# Patient Record
Sex: Female | Born: 1935 | Race: White | Hispanic: No | Smoking: Never smoker
Health system: Southern US, Community
[De-identification: ages and names within clinical notes are randomized; demographics above are authoritative.]

## PROBLEM LIST (undated history)

## (undated) DIAGNOSIS — C801 Malignant (primary) neoplasm, unspecified: Secondary | ICD-10-CM

## (undated) DIAGNOSIS — R0602 Shortness of breath: Secondary | ICD-10-CM

## (undated) DIAGNOSIS — G20A1 Parkinson's disease without dyskinesia, without mention of fluctuations: Secondary | ICD-10-CM

## (undated) DIAGNOSIS — Z9049 Acquired absence of other specified parts of digestive tract: Secondary | ICD-10-CM

## (undated) DIAGNOSIS — I2510 Atherosclerotic heart disease of native coronary artery without angina pectoris: Secondary | ICD-10-CM

## (undated) DIAGNOSIS — H3530 Unspecified macular degeneration: Secondary | ICD-10-CM

## (undated) DIAGNOSIS — K219 Gastro-esophageal reflux disease without esophagitis: Secondary | ICD-10-CM

## (undated) DIAGNOSIS — Z955 Presence of coronary angioplasty implant and graft: Secondary | ICD-10-CM

## (undated) DIAGNOSIS — F419 Anxiety disorder, unspecified: Secondary | ICD-10-CM

## (undated) DIAGNOSIS — Z9289 Personal history of other medical treatment: Secondary | ICD-10-CM

## (undated) DIAGNOSIS — K5792 Diverticulitis of intestine, part unspecified, without perforation or abscess without bleeding: Secondary | ICD-10-CM

## (undated) DIAGNOSIS — K224 Dyskinesia of esophagus: Secondary | ICD-10-CM

## (undated) DIAGNOSIS — I10 Essential (primary) hypertension: Secondary | ICD-10-CM

## (undated) DIAGNOSIS — R21 Rash and other nonspecific skin eruption: Secondary | ICD-10-CM

## (undated) DIAGNOSIS — H9190 Unspecified hearing loss, unspecified ear: Secondary | ICD-10-CM

## (undated) DIAGNOSIS — J189 Pneumonia, unspecified organism: Secondary | ICD-10-CM

## (undated) DIAGNOSIS — IMO0002 Reserved for concepts with insufficient information to code with codable children: Secondary | ICD-10-CM

## (undated) DIAGNOSIS — E039 Hypothyroidism, unspecified: Secondary | ICD-10-CM

## (undated) DIAGNOSIS — K229 Disease of esophagus, unspecified: Secondary | ICD-10-CM

## (undated) DIAGNOSIS — Z8719 Personal history of other diseases of the digestive system: Secondary | ICD-10-CM

## (undated) DIAGNOSIS — G20 Parkinson's disease: Secondary | ICD-10-CM

## (undated) DIAGNOSIS — K469 Unspecified abdominal hernia without obstruction or gangrene: Secondary | ICD-10-CM

## (undated) DIAGNOSIS — Z87440 Personal history of urinary (tract) infections: Secondary | ICD-10-CM

## (undated) DIAGNOSIS — M1990 Unspecified osteoarthritis, unspecified site: Secondary | ICD-10-CM

## (undated) HISTORY — PX: ABDOMINAL HYSTERECTOMY: SHX81

## (undated) HISTORY — PX: OTHER SURGICAL HISTORY: SHX169

## (undated) HISTORY — DX: Dyskinesia of esophagus: K22.4

## (undated) HISTORY — DX: Essential (primary) hypertension: I10

## (undated) HISTORY — PX: LUMBAR LAMINECTOMY: SHX95

## (undated) HISTORY — PX: BLADDER REPAIR: SHX76

## (undated) HISTORY — PX: HEMORRHOID SURGERY: SHX153

---
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
## 1976-03-31 HISTORY — PX: HERNIA REPAIR: SHX51

## 1990-03-31 HISTORY — PX: KNEE ARTHROSCOPY: SHX127

## 1990-03-31 HISTORY — PX: KNEE SURGERY: SHX244

## 1999-04-01 HISTORY — PX: CORONARY ANGIOPLASTY WITH STENT PLACEMENT: SHX49

## 2004-03-31 HISTORY — PX: OTHER SURGICAL HISTORY: SHX169

## 2004-03-31 HISTORY — PX: TOTAL ABDOMINAL HYSTERECTOMY W/ BILATERAL SALPINGOOPHORECTOMY: SHX83

## 2007-04-01 HISTORY — PX: LAMINECTOMY: SHX219

## 2009-03-31 HISTORY — PX: EYE SURGERY: SHX253

## 2009-08-26 ENCOUNTER — Emergency Department (HOSPITAL_BASED_OUTPATIENT_CLINIC_OR_DEPARTMENT_OTHER): Admission: EM | Admit: 2009-08-26 | Discharge: 2009-08-27 | Payer: Self-pay | Admitting: Emergency Medicine

## 2010-03-31 ENCOUNTER — Emergency Department (HOSPITAL_BASED_OUTPATIENT_CLINIC_OR_DEPARTMENT_OTHER)
Admission: EM | Admit: 2010-03-31 | Discharge: 2010-03-31 | Disposition: A | Discharge status: Other Acute Inpatient Hospital | Payer: Self-pay | Source: Home / Self Care | Admitting: Emergency Medicine

## 2010-03-31 ENCOUNTER — Inpatient Hospital Stay (HOSPITAL_COMMUNITY)
Admission: AD | Admit: 2010-03-31 | Discharge: 2010-04-10 | Payer: Self-pay | Source: Home / Self Care | Attending: Internal Medicine | Admitting: Internal Medicine

## 2010-04-03 LAB — DIFFERENTIAL
Basophils Absolute: 0.1 10*3/uL (ref 0.0–0.1)
Basophils Relative: 1 % (ref 0–1)
Eosinophils Absolute: 0.5 10*3/uL (ref 0.0–0.7)
Eosinophils Relative: 8 % — ABNORMAL HIGH (ref 0–5)
Lymphocytes Relative: 18 % (ref 12–46)
Lymphs Abs: 1.1 10*3/uL (ref 0.7–4.0)
Monocytes Absolute: 0.7 10*3/uL (ref 0.1–1.0)
Monocytes Relative: 11 % (ref 3–12)
Neutro Abs: 3.9 10*3/uL (ref 1.7–7.7)
Neutrophils Relative %: 62 % (ref 43–77)

## 2010-04-03 LAB — CBC
HCT: 36 % (ref 36.0–46.0)
Hemoglobin: 11.5 g/dL — ABNORMAL LOW (ref 12.0–15.0)
MCH: 30.3 pg (ref 26.0–34.0)
MCHC: 31.9 g/dL (ref 30.0–36.0)
MCV: 94.7 fL (ref 78.0–100.0)
Platelets: 279 10*3/uL (ref 150–400)
RBC: 3.8 MIL/uL — ABNORMAL LOW (ref 3.87–5.11)
RDW: 14.3 % (ref 11.5–15.5)
WBC: 6.3 10*3/uL (ref 4.0–10.5)

## 2010-04-03 LAB — BASIC METABOLIC PANEL
BUN: 5 mg/dL — ABNORMAL LOW (ref 6–23)
CO2: 34 mEq/L — ABNORMAL HIGH (ref 19–32)
Calcium: 9.2 mg/dL (ref 8.4–10.5)
Chloride: 91 mEq/L — ABNORMAL LOW (ref 96–112)
Creatinine, Ser: 0.71 mg/dL (ref 0.4–1.2)
GFR calc Af Amer: >60 mL/min (ref >60)
GFR calc non Af Amer: >60 mL/min (ref >60)
Glucose, Bld: 95 mg/dL (ref 70–99)
Potassium: 3.4 mEq/L — ABNORMAL LOW (ref 3.5–5.1)
Sodium: 134 mEq/L — ABNORMAL LOW (ref 135–145)

## 2010-04-05 LAB — CBC
HCT: 35.2 % — ABNORMAL LOW (ref 36.0–46.0)
Hemoglobin: 11.4 g/dL — ABNORMAL LOW (ref 12.0–15.0)
MCH: 30.8 pg (ref 26.0–34.0)
MCHC: 32.4 g/dL (ref 30.0–36.0)
MCV: 95.1 fL (ref 78.0–100.0)
Platelets: 296 10*3/uL (ref 150–400)
RBC: 3.7 MIL/uL — ABNORMAL LOW (ref 3.87–5.11)
RDW: 14 % (ref 11.5–15.5)
WBC: 6.5 10*3/uL (ref 4.0–10.5)

## 2010-04-05 LAB — BASIC METABOLIC PANEL
BUN: 9 mg/dL (ref 6–23)
CO2: 33 mEq/L — ABNORMAL HIGH (ref 19–32)
Calcium: 9.3 mg/dL (ref 8.4–10.5)
Chloride: 92 mEq/L — ABNORMAL LOW (ref 96–112)
Creatinine, Ser: 0.76 mg/dL (ref 0.4–1.2)
GFR calc Af Amer: >60 mL/min (ref >60)
GFR calc non Af Amer: >60 mL/min (ref >60)
Glucose, Bld: 84 mg/dL (ref 70–99)
Potassium: 3.4 mEq/L — ABNORMAL LOW (ref 3.5–5.1)
Sodium: 135 mEq/L (ref 135–145)

## 2010-04-05 LAB — APTT: aPTT: 44 seconds — ABNORMAL HIGH (ref 24–37)

## 2010-04-05 LAB — PROTIME-INR
INR: 1.05 (ref 0.00–1.49)
Prothrombin Time: 13.9 seconds (ref 11.6–15.2)

## 2010-04-15 LAB — BASIC METABOLIC PANEL
BUN: 10 mg/dL (ref 6–23)
BUN: 10 mg/dL (ref 6–23)
CO2: 31 mEq/L (ref 19–32)
CO2: 22 mEq/L (ref 19–32)
Calcium: 9.1 mg/dL (ref 8.4–10.5)
Calcium: 9 mg/dL (ref 8.4–10.5)
Chloride: 98 mEq/L (ref 96–112)
Chloride: 104 mEq/L (ref 96–112)
Creatinine, Ser: 0.78 mg/dL (ref 0.4–1.2)
Creatinine, Ser: 0.83 mg/dL (ref 0.4–1.2)
GFR calc Af Amer: >60 mL/min (ref >60)
GFR calc Af Amer: >60 mL/min (ref >60)
GFR calc non Af Amer: >60 mL/min (ref >60)
GFR calc non Af Amer: >60 mL/min (ref >60)
Glucose, Bld: 102 mg/dL — ABNORMAL HIGH (ref 70–99)
Glucose, Bld: 57 mg/dL — ABNORMAL LOW (ref 70–99)
Potassium: 3.7 mEq/L (ref 3.5–5.1)
Potassium: 3.7 mEq/L (ref 3.5–5.1)
Sodium: 138 mEq/L (ref 135–145)
Sodium: 139 mEq/L (ref 135–145)

## 2010-04-15 LAB — CBC
HCT: 36.3 % (ref 36.0–46.0)
HCT: 38.2 % (ref 36.0–46.0)
Hemoglobin: 11.7 g/dL — ABNORMAL LOW (ref 12.0–15.0)
Hemoglobin: 12.2 g/dL (ref 12.0–15.0)
MCH: 30.5 pg (ref 26.0–34.0)
MCH: 30.3 pg (ref 26.0–34.0)
MCHC: 32.2 g/dL (ref 30.0–36.0)
MCHC: 31.9 g/dL (ref 30.0–36.0)
MCV: 94.8 fL (ref 78.0–100.0)
MCV: 95 fL (ref 78.0–100.0)
Platelets: 292 10*3/uL (ref 150–400)
Platelets: 298 10*3/uL (ref 150–400)
RBC: 3.83 MIL/uL — ABNORMAL LOW (ref 3.87–5.11)
RBC: 4.02 MIL/uL (ref 3.87–5.11)
RDW: 14 % (ref 11.5–15.5)
RDW: 14.1 % (ref 11.5–15.5)
WBC: 7.9 10*3/uL (ref 4.0–10.5)
WBC: 5.6 10*3/uL (ref 4.0–10.5)

## 2010-04-15 LAB — D-DIMER, QUANTITATIVE: D-Dimer, Quant: 1.16 ug/mL-FEU — ABNORMAL HIGH (ref 0.00–0.48)

## 2010-04-15 LAB — BRAIN NATRIURETIC PEPTIDE: Pro B Natriuretic peptide (BNP): 76.4 pg/mL (ref 0.0–100.0)

## 2010-04-19 ENCOUNTER — Ambulatory Visit
Admission: RE | Admit: 2010-04-19 | Discharge: 2010-04-19 | Payer: Self-pay | Source: Home / Self Care | Attending: Internal Medicine | Admitting: Internal Medicine

## 2010-04-19 DIAGNOSIS — I509 Heart failure, unspecified: Secondary | ICD-10-CM | POA: Insufficient documentation

## 2010-04-19 DIAGNOSIS — J309 Allergic rhinitis, unspecified: Secondary | ICD-10-CM | POA: Insufficient documentation

## 2010-04-19 DIAGNOSIS — R32 Unspecified urinary incontinence: Secondary | ICD-10-CM | POA: Insufficient documentation

## 2010-04-19 DIAGNOSIS — G252 Other specified forms of tremor: Secondary | ICD-10-CM

## 2010-04-19 DIAGNOSIS — M4800 Spinal stenosis, site unspecified: Secondary | ICD-10-CM | POA: Insufficient documentation

## 2010-04-19 DIAGNOSIS — R05 Cough: Secondary | ICD-10-CM | POA: Insufficient documentation

## 2010-04-19 DIAGNOSIS — K219 Gastro-esophageal reflux disease without esophagitis: Secondary | ICD-10-CM | POA: Insufficient documentation

## 2010-04-19 DIAGNOSIS — I10 Essential (primary) hypertension: Secondary | ICD-10-CM | POA: Insufficient documentation

## 2010-04-19 DIAGNOSIS — G250 Essential tremor: Secondary | ICD-10-CM | POA: Insufficient documentation

## 2010-04-25 NOTE — Discharge Summary (Addendum)
Caitlin Richards, Caitlin Richards               ACCOUNT NO.:  [id number]  MEDICAL RECORD NO.:  [id number]          PATIENT TYPE:  INP  LOCATION:  1331                         FACILITY:  [facility]  PHYSICIAN:  Hind I Elsaid, MD      DATE OF BIRTH:  July 19, 1916  DATE OF ADMISSION:  03/31/2010 DATE OF DISCHARGE:  04/09/2010                              DISCHARGE SUMMARY   PRIMARY CARE PHYSICIAN:  Joycelyn Rua, MD  DISCHARGE DIAGNOSES: 1. Community-acquired pneumonia. 2. Acute bronchitis, most probably bacterial bronchitis. 3. Systemic inflammatory response, resolved. 4. Dysphagia, felt to be secondary to achalasia status post     esophagogastroduodenoscopy and Botox injection. 5. History of Parkinsonism. 6. Constipation. 7. Questionable depression. 8. Hypothyroidism.  CONSULTATION:  Gastroenterology consulted, done by Jordan Hawks. Elnoria Howard, MD.  PROCEDURE: 1. Chest x-ray, lower lobe prominent interstitial  thickening, favored     to represent chronic bronchitis/smoking. 2. Chest x-ray, increased bilateral lower lobe atelectasis suspicious     for worsening pneumonia  and aspiration. 3. Barium swallow.  Severe stricture at the gastroesophageal junction,     differential includes benign/malignant stricture, favor benign     stricture, large hiatal hernia. 4. Chest x-ray, slight improvement in aeration in the lung bases.  HISTORY OF PRESENT ILLNESS:  This is a 77 year old female who was evaluated  at [facility]  for cough and shortness of breath.  The patient admitted to have 2 weeks history of severe cough with wheezing and shortness of breath.  She has been coughing yellowish- colored mucus.  The patient tried outpatient medical management with nebulizer and oral antibiotic without significant improvement. Accordingly, the patient presented to the hospital with a fever of 100.4.  In the emergency room, the patient had a temperature of 98.3 and T-max of 100.4, pulse rate 81,  respiratory rate 17, and blood pressure 121/61, saturating 96% on 2 L.  The patient also found to have white blood cells of 13.9.  The patient admitted to the hospital,  treated as a case of acute bronchitis/community acquired pneumonia with Zithromax and Rocephin.  The patient had significant coughing with mucus, yellowish phlegm.  This was treated supportively.  The patient's cough completely resolved.  Currently, the patient was able to complete full sentence without coughing, but also the patient was noticed to have dysphagia.  For that esophagram was done,  which did show severe stricture.  Gastroenterology consulted, done by Dr. Elnoria Howard, where he recommended manometry.  The patient kept over the weekend for evaluation of manometry, but unfortunately not been  done as the patient did not tolerate it.  The patient had another endoscopy today, which did show achalasia and 10 cm  hiatal hernia, status post 25 units of Botox injection.  There was no evidence of a stricture or secondary achalasia. The patient tolerated the procedure well, but the patient is still today feeling weak with some shortness of breath.  I will repeat chest x-ray two-view AP and lateral  and D-dimer.  If all of this is negative, the patient could be discharged tomorrow with Avelox 400 mg p.o. daily to complete a total  of 14 days.  The patient also needs to continue with her nebulizer treatment.  Currently, we feel the patient is stable for discharge in the morning.     Hind Bosie Helper, MD     HIE/MEDQ  D:  04/09/2010  T:  04/09/2010  Job:  [id number]  Electronically Signed by Ebony Cargo MD on 04/25/2010 12:44:51 PM

## 2010-05-02 NOTE — Assessment & Plan Note (Signed)
Summary: Pulmonary/ new pt eval ? uacs/ pseudo wheeze   Visit Type:  Initial Consult Copy to:  Dr. Joycelyn Rua Primary Provider/Referring Provider:  Dr. Joycelyn Rua  CC:  Followup PNA.  History of Present Illness: 83 yowf never regular smoker bothered by "allergies and asthma all her life" under care of an allergist last well  2009 where only only needed an occasional allegra while living in [place].  April 19, 2010  1st pulmonary office eval ov new onset difficulty breathing and sense of nose and chest congestion indolent/ progressive but especially bad since October 2011. no better with allegra or spiriva then ended up in hospital with dx of pna and very large HH dx'd on ct with severe stricture on barium swallow   rx as cap, botox rx for ? achalasia and swallowing better, still cough mostly dry assoc with nasal congestion but no purulent nasal secretions.   Pt denies presently  any significant sore throat, dysphagia, itching, sneezing,  fever, chills, sweats, unintended wt loss, pleuritic or exertional cp, hempoptysis, change in activity tolerance  orthopnea pnd or leg swelling. Pt also denies any obvious fluctuation in symptoms with weather or environmental change or other alleviating or aggravating factors.       Current Medications (verified): 1)  Levothyroxine Sodium 25 Mcg Tabs (Levothyroxine Sodium) .Marland Kitchen.. 1 Once Daily 2)  Hydrochlorothiazide 12.5 Mg Caps (Hydrochlorothiazide) .Marland Kitchen.. 1 Once Daily 3)  Metoprolol Tartrate 50 Mg Tabs (Metoprolol Tartrate) .Marland Kitchen.. 1 Once Daily 4)  Carbidopa-Levodopa 10-100 Mg Tabs (Carbidopa-Levodopa) .Marland Kitchen.. 1 Three Times A Day 5)  Detrol La 4 Mg Xr24h-Cap (Tolterodine Tartrate) .Marland Kitchen.. 1 At Bedtime 6)  Aspirin 81 Mg Tbec (Aspirin) .Marland Kitchen.. 1 Once Daily 7)  Centrum Silver  Tabs (Multiple Vitamins-Minerals) .Marland Kitchen.. 1 Once Daily 8)  Calcium Plus Vitamin D 600-100 Mg-Unit Caps (Calcium Carbonate-Vitamin D) .Marland Kitchen.. 1  Two Times A Day 9)  Biotin 5000 5 Mg Caps (Biotin)  .Marland Kitchen.. 1 Once Daily 10)  Fish Oil 1200 Mg Caps (Omega-3 Fatty Acids) .Marland Kitchen.. 1 At Bedtime 11)  Optive 0.5-0.9 % Soln (Carboxymethylcellul-Glycerin) .Marland Kitchen.. 1 Drop Each Eye Two Times A Day 12)  Prilosec 20 Mg Cpdr (Omeprazole) .Marland Kitchen.. 1 Every Am 13)  Docusate Sodium 100 Mg Caps (Docusate Sodium) .Marland Kitchen.. 1 Two Times A Day As Needed 14)  Dulcolax 5 Mg Tbec (Bisacodyl) .... 2 At Bedtime As Needed 15)  Nasonex 50 Mcg/act Susp (Mometasone Furoate) .... 2 Sprays Each Nostril Once Daily As Needed 16)  Allegra Allergy 60 Mg Tabs (Fexofenadine Hcl) .Marland Kitchen.. 1 Once Daily As Needed 17)  Patanol 0.1 % Soln (Olopatadine Hcl) .Marland Kitchen.. 1 Drop Each Eye Two Times A Day As Needed  Allergies (verified): 1)  ! Sulfa 2)  ! * Hydralazine 3)  ! * Plavix 4)  ! Percocet 5)  ! * Lisinopril  Past History:  Past Medical History: Esophageal dysfunction ? achalasia......................Marland KitchenHung      - EGD with botox 04/09/2010 Allergies/ asthma....................................................Marland KitchenWert  Past Surgical History: Appendectomy 1934 T & A 1925 Hysterectomy 1959 Hernia repair 1978 Rt knee surgery 1992 Cardiac stent 2001 Oopherectomy 2006 Bilateral cataract surgery 2006 L4, L5 Laminectomy 2009  Family History: Asthma- Father and PGM  Social History: Widowed with 3 children Never smoker Lives with daughter RetiredFirefighter of medical record dept  ETOH rare  Review of Systems       The patient complains of nasal congestion/difficulty breathing through nose.  The patient denies shortness of breath with activity, shortness of breath at rest,  productive cough, non-productive cough, coughing up blood, chest pain, irregular heartbeats, acid heartburn, indigestion, loss of appetite, weight change, abdominal pain, difficulty swallowing, sore throat, tooth/dental problems, headaches, sneezing, itching, ear ache, anxiety, depression, hand/feet swelling, joint stiffness or pain, rash, change in color of mucus, and fever.     Vital Signs:  Patient profile:   77 year old female Height:      62 inches Weight:      135 pounds BMI:     24.78 O2 Sat:      94 % on Room air Temp:     98.0 degrees F oral Pulse rate:   83 / minute BP sitting:   130 / 74  (left arm)  Vitals Entered By: Vernie Murders (April 19, 2010 9:46 AM)  O2 Flow:  Room air  Physical Exam  Additional Exam:  pleasant amb wf < stated age quite hoarse with classic pseudowheeze resolves with purse lip maneuver  wt 135 April 20, 2010  HEENT mild turbinate edema.  Oropharynx no thrush or excess pnd or cobblestoning.  No JVD or cervical adenopathy. Mild accessory muscle hypertrophy. Trachea midline, nl thryroid. Chest was hyperinflated by percussion with diminished breath sounds and moderate increased exp time without wheeze. Hoover sign positive at mid inspiration. Regular rate and rhythm without murmur gallop or rub or increase P2 or edema.  Abd: no hsm, nl excursion. Ext warm without cyanosis or clubbing.     CT of Chest  Procedure date:  04/10/2010  Findings:      Multiple bilateral small pathchy infiltrates assoc with hube HH and thickening of the esphageal mucosa diffusely  Impression & Recommendations:  Problem # 1:  COUGH (ICD-786.2)  The most common causes of chronic cough in immunocompetent adults include: upper airway cough syndrome (UACS), previously referred to as postnasal drip syndrome,  caused by variety of rhinosinus conditions; (2) asthma; (3) GERD; (4) chronic bronchitis from cigarette smoking or other inhaled environmental irritants; (5) nonasthmatic eosinophilic bronchitis; and (6) bronchiectasis. These conditions, singly or in combination, have accounted for up to 94% of the causes of chronic cough in prospective studies.   DDX of  difficult airways managment all start with A and  include Adherence, Ace Inhibitors, Acid Reflux, Active Sinus Disease, Alpha 1 Antitripsin deficiency, Anxiety masquerading as Airways dz,   ABPA,  allergy(esp in young), Aspiration (esp in elderly), Adverse effects of DPI,  Active smokers, plus two Bs  = Beta blocker use/ bronchiectasis.  and one C = CHF  Cross refrencing this ddx yields the most likely dx here:  Acid reflux:  max rx reviewed  Beta blockers: prefer here Bystolic, the most beta -1  selective Beta blocker available in sample form, with bisoprolol the most selective generic choice  on the market.   Aspiration, still a concern given age and underlying achalasia reported  Active sinus dz > needs sinus ct if not improving  Bronchiectasis excluded by ct   Each maintenance medication was reviewed in detail including most importantly the difference between maintenance and as needed and under what circumstances the prns are to be used. See instructions for specific recommendations   Orders: New Patient Level V (24401)  Medications Added to Medication List This Visit: 1)  Levothyroxine Sodium 25 Mcg Tabs (Levothyroxine sodium) .Marland Kitchen.. 1 once daily 2)  Hydrochlorothiazide 12.5 Mg Caps (Hydrochlorothiazide) .Marland Kitchen.. 1 once daily 3)  Metoprolol Tartrate 50 Mg Tabs (Metoprolol tartrate) .Marland Kitchen.. 1 once daily 4)  Carbidopa-levodopa 10-100 Mg Tabs (Carbidopa-levodopa) .Marland KitchenMarland KitchenMarland Kitchen  1 three times a day 5)  Detrol La 4 Mg Xr24h-cap (Tolterodine tartrate) .Marland Kitchen.. 1 at bedtime 6)  Aspirin 81 Mg Tbec (Aspirin) .Marland Kitchen.. 1 once daily 7)  Centrum Silver Tabs (Multiple vitamins-minerals) .Marland Kitchen.. 1 once daily 8)  Calcium Plus Vitamin D 600-100 Mg-unit Caps (Calcium carbonate-vitamin d) .Marland Kitchen.. 1  two times a day 9)  Biotin 5000 5 Mg Caps (Biotin) .Marland Kitchen.. 1 once daily 10)  Fish Oil 1200 Mg Caps (Omega-3 fatty acids) .Marland Kitchen.. 1 at bedtime 11)  Optive 0.5-0.9 % Soln (Carboxymethylcellul-glycerin) .Marland Kitchen.. 1 drop each eye two times a day 12)  Prilosec 20 Mg Cpdr (Omeprazole) .Marland Kitchen.. 1 every am 13)  Prilosec 20 Mg Cpdr (Omeprazole) .... Take one 30-60 min before first and last meals of the day 14)  Docusate Sodium 100 Mg Caps  (Docusate sodium) .Marland Kitchen.. 1 two times a day as needed 15)  Dulcolax 5 Mg Tbec (Bisacodyl) .... 2 at bedtime as needed 16)  Nasonex 50 Mcg/act Susp (Mometasone furoate) .... 2 sprays each nostril once daily as needed 17)  Allegra Allergy 60 Mg Tabs (Fexofenadine hcl) .Marland Kitchen.. 1 once daily as needed 18)  Patanol 0.1 % Soln (Olopatadine hcl) .Marland Kitchen.. 1 drop each eye two times a day as needed 19)  Pepcid 20 Mg Tabs (Famotidine) .... Take one by mouth at bedtime 20)  Bystolic 5 Mg Tabs (Nebivolol hcl) .... One tablet daily   Patient Instructions: 1)  stop metaprolol and fish oil 2)  Bystolic 5mg  one daily in place of metaprolol 3)  Prilosec 20mg   Take one 30-60 min before first and last meals of the day and Pepcid 20 mg one at bedtime 4)  GERD (REFLUX)  is a common cause of respiratory symptoms. It commonly presents without heartburn and can be treated with medication, but also with lifestyle changes including avoidance of late meals, excessive alcohol, smoking cessation, and avoid fatty foods, chocolate, peppermint, colas, red wine, and acidic juices such as orange juice. NO MINT OR MENTHOL PRODUCTS SO NO COUGH DROPS  5)  USE SUGARLESS CANDY INSTEAD (jolley ranchers)  6)  NO OIL BASED VITAMINS  7)  ok restart spiriva if you feel you're loosing ground 8)  Please schedule a follow-up appointment in 2 weeks, sooner if needed with cxr on return   Immunization History:  Influenza Immunization History:    Influenza:  historical (12/31/2009)  Pneumovax Immunization History:    Pneumovax:  historical (04/01/2010)

## 2010-05-03 ENCOUNTER — Other Ambulatory Visit: Payer: Self-pay | Admitting: Internal Medicine

## 2010-05-03 ENCOUNTER — Ambulatory Visit (INDEPENDENT_AMBULATORY_CARE_PROVIDER_SITE_OTHER)
Admission: RE | Admit: 2010-05-03 | Discharge: 2010-05-03 | Disposition: A | Discharge status: Home / Self Care | Payer: Medicare Other | Source: Ambulatory Visit | Attending: Internal Medicine | Admitting: Internal Medicine

## 2010-05-03 ENCOUNTER — Encounter: Payer: Self-pay | Admitting: Internal Medicine

## 2010-05-03 ENCOUNTER — Ambulatory Visit: Admit: 2010-05-03 | Payer: Self-pay | Admitting: Internal Medicine

## 2010-05-03 ENCOUNTER — Ambulatory Visit (INDEPENDENT_AMBULATORY_CARE_PROVIDER_SITE_OTHER): Payer: Medicare Other | Admitting: Internal Medicine

## 2010-05-03 DIAGNOSIS — J309 Allergic rhinitis, unspecified: Secondary | ICD-10-CM

## 2010-05-03 DIAGNOSIS — R05 Cough: Secondary | ICD-10-CM

## 2010-05-08 NOTE — Assessment & Plan Note (Signed)
Summary: Pulmonary/ ext summary ov for upper airway cough   Vital Signs:  Patient profile:   77 year old female Weight:      135 pounds O2 Sat:      95 % on Room air Temp:     98.2 degrees F oral Pulse rate:   66 / minute BP sitting:   114 / 62  (left arm)  Vitals Entered By: Vernie Murders (May 03, 2010 9:09 AM)  O2 Flow:  Room air  Copy to:  Dr. Joycelyn Rua Primary Provider/Referring Provider:  Dr. Joycelyn Rua  CC:  Dyspnea- improved.  History of Present Illness: 18 yowf never regular smoker bothered by "allergies and asthma all her life" under care of an allergist last well  2009 where only only needed an occasional allegra while living in [place].  April 19, 2010  1st pulmonary office eval ov new onset difficulty breathing and sense of nose and chest congestion indolent/ progressive but especially bad since October 2011. no better with allegra or spiriva then ended up in hospital with dx of pna and very large HH dx'd on ct with severe stricture on barium swallow   rx as cap, botox rx for ? achalasia and swallowing better, still cough mostly dry assoc with nasal congestion but no purulent nasal secretions.  imp was uacs vs asthma  rec stop metaprolol and fish oil Bystolic 5mg  one daily in place of metaprolol Prilosec 20mg   Take one 30-60 min before first and last meals of the day and Pepcid 20 mg one at bedtime GERD (REFLUX) diet   ok restart spiriva if you feel you're loosing ground > did not stop it ? why    May 03, 2010 ov cc cough assoc with throat and nasal congestion in am using nasonex as needed in ams only. no purulent secretions.  not limited by sob or having noct resp cos.  Pt denies any significant sore throat, dysphagia, itching, sneezing,   fever, chills, sweats, unintended wt loss, pleuritic or exertional cp, hempoptysis, change in activity tolerance  orthopnea pnd or leg swelling. Pt also denies any obvious fluctuation in symptoms with weather or  environmental change or other alleviating or aggravating factors.             Current Medications (verified): 1)  Levothyroxine Sodium 25 Mcg Tabs (Levothyroxine Sodium) .Marland Kitchen.. 1 Once Daily 2)  Hydrochlorothiazide 12.5 Mg Caps (Hydrochlorothiazide) .Marland Kitchen.. 1 Once Daily 3)  Carbidopa-Levodopa 10-100 Mg Tabs (Carbidopa-Levodopa) .Marland Kitchen.. 1 Every Am, 1 Every Afternoon and 2 Every Evening 4)  Detrol La 4 Mg Xr24h-Cap (Tolterodine Tartrate) .Marland Kitchen.. 1 At Bedtime 5)  Aspirin 81 Mg Tbec (Aspirin) .Marland Kitchen.. 1 Once Daily 6)  Centrum Silver  Tabs (Multiple Vitamins-Minerals) .Marland Kitchen.. 1 Once Daily 7)  Calcium Plus Vitamin D 600-100 Mg-Unit Caps (Calcium Carbonate-Vitamin D) .Marland Kitchen.. 1  Two Times A Day 8)  Optive 0.5-0.9 % Soln (Carboxymethylcellul-Glycerin) .Marland Kitchen.. 1 Drop Each Eye Two Times A Day 9)  Prilosec 20 Mg Cpdr (Omeprazole) .... Take One 30-60 Min Before First and Last Meals of The Day 10)  Docusate Sodium 100 Mg Caps (Docusate Sodium) .Marland Kitchen.. 1 Two Times A Day As Needed 11)  Dulcolax 5 Mg Tbec (Bisacodyl) .... 2 At Bedtime As Needed 12)  Nasonex 50 Mcg/act Susp (Mometasone Furoate) .... 2 Sprays Each Nostril Once Daily As Needed 13)  Allegra Allergy 60 Mg Tabs (Fexofenadine Hcl) .Marland Kitchen.. 1 Once Daily As Needed 14)  Patanol 0.1 % Soln (Olopatadine Hcl) .Marland KitchenMarland KitchenMarland Kitchen  1 Drop Each Eye Two Times A Day As Needed 15)  Pepcid 20 Mg Tabs (Famotidine) .... Take One By Mouth At Bedtime 16)  Bystolic 5 Mg  Tabs (Nebivolol Hcl) .... One Tablet Daily 17)  Spiriva Handihaler 18 Mcg Caps (Tiotropium Bromide Monohydrate) .... Inhale Contents of 1 Cap Daily  Allergies (verified): 1)  ! Sulfa 2)  ! * Hydralazine 3)  ! * Plavix 4)  ! Percocet 5)  ! * Lisinopril  Past History:  Past Medical History: Esophageal dysfunction ? achalasia......................Marland KitchenHung      - EGD with botox 04/09/2010 Allergies/ asthma.....................................................Marland KitchenWert HBP    - Changed metaprolol to bystolic Apr 19 2010 due to ? B2  effect  Physical Exam  Additional Exam:  pleasant amb wf < stated age quite hoarse with classic pseudowheeze resolves completely with purse lip maneuver  wt 135 April 20, 2010 > 135 May 03, 2010  HEENT mild turbinate edema.  Oropharynx no thrush or excess pnd or cobblestoning.  No JVD or cervical adenopathy. Mild accessory muscle hypertrophy. Trachea midline, nl thryroid. Chest was hyperinflated by percussion with diminished breath sounds and moderate increased exp time without wheeze. Hoover sign positive at mid inspiration. Regular rate and rhythm without murmur gallop or rub or increase P2 or edema.  Abd: no hsm, nl excursion. Ext warm without cyanosis or clubbing.     Impression & Recommendations:  Problem # 1:  COUGH (ICD-786.2)  The most common causes of chronic cough in immunocompetent adults include: upper airway cough syndrome (UACS), previously referred to as postnasal drip syndrome,  caused by variety of rhinosinus conditions; (2) asthma; (3) GERD; (4) chronic bronchitis from cigarette smoking or other inhaled environmental irritants; (5) nonasthmatic eosinophilic bronchitis; and (6) bronchiectasis. These conditions, singly or in combination, have accounted for up to 94% of the causes of chronic cough in prospective studies.   DDX of  difficult airways managment all start with A and  include Adherence, Ace Inhibitors, Acid Reflux, Active Sinus Disease, Alpha 1 Antitripsin deficiency, Anxiety masquerading as Airways dz,  ABPA,  allergy(esp in young), Aspiration (esp in elderly), Adverse effects of DPI,  Active smokers, plus two Bs  = Beta blocker use/ bronchiectasis.  and one C = CHF  Cross refrencing this ddx yields the most likely dx here:  Acid reflux:  max rx reviewed  Beta blockers: prefer here Bystolic, the most beta -1  selective Beta blocker available in sample form, with bisoprolol the most selective generic choice  on the market.   Aspiration, still a concern given  age and underlying achalasia reported  Active sinus dz > needs sinus ct if not improving  ? adverse effect of DPI  > try off spiriva as per previous recs.  If she has copd at all it would be most likely the nl result of aging and not accelerated airflow obstruction. as long as not limting doe, may ge  no need for spiriva even if she does prove to have it.  Bronchiectasis excluded by ct   Each maintenance medication was reviewed in detail including most importantly the difference between maintenance and as needed and under what circumstances the prns are to be used. See instructions for specific recommendation    Problem # 2:  ALLERGIC RHINITIS (ICD-477.9)  Her updated medication list for this problem includes:    Nasonex 50 Mcg/act Susp (Mometasone furoate) .Marland Kitchen... 2 sprays each nostril once daily as needed    Allegra Allergy 60 Mg Tabs (Fexofenadine hcl) .Marland KitchenMarland KitchenMarland KitchenMarland Kitchen  1 once daily as needed  Due to am symptoms needs to use nasonex at hs.   Each maintenance medication was reviewed in detail including most importantly the difference between maintenance and as needed and under what circumstances the prns are to be used. See instructions for specific recommendations      Medications Added to Medication List This Visit: 1)  Carbidopa-levodopa 10-100 Mg Tabs (Carbidopa-levodopa) .Marland Kitchen.. 1 every am, 1 every afternoon and 2 every evening 2)  Spiriva Handihaler 18 Mcg Caps (Tiotropium bromide monohydrate) .... Inhale contents of 1 cap daily  Other Orders: T-2 View CXR (71020TC) Est. Patient Level IV (16109)  Patient Instructions: 1)  Stop spiriva.  I think of spiriva in this setting like purchasing high octane fuel for an older car with lots of miles on it. It may help the perfomance enough to warrant the purchase, but it won't change the longevity of the car or make it any easier parking it. It should improve peak performance if the patient is patient and lets the medicine work the way it's intended  -  so when it's stopped it takes a while to wear off 2)  Nasonex should be used every night at bedtime 3)  Calcium supplements should be citrate, not carbonate 4)  Please schedule a follow-up appointment in 6 weeks, sooner if needed    Orders Added: 1)  T-2 View CXR [[id number]] 2)  Est. Patient Level IV [60454]     CXR  Procedure date:  05/03/2010  Findings:      1.  Since 04/10/2010, chest x-ray resolution previous patchy bilateral lower lobe airspace opacities (best visualized on CT angio chest).  Stable perihilar peribronchial cuffing of known asthma. 2.  Large hiatus hernia. 3.  No interval acute findings

## 2010-06-10 LAB — URINALYSIS, ROUTINE W REFLEX MICROSCOPIC
Glucose, UA: NEGATIVE mg/dL
Ketones, ur: 40 mg/dL — AB
Leukocytes, UA: NEGATIVE
Nitrite: NEGATIVE
Specific Gravity, Urine: 1.015 (ref 1.005–1.030)
pH: 6 (ref 5.0–8.0)

## 2010-06-10 LAB — BASIC METABOLIC PANEL
BUN: 11 mg/dL (ref 6–23)
BUN: 6 mg/dL (ref 6–23)
CO2: 26 mEq/L (ref 19–32)
CO2: 30 mEq/L (ref 19–32)
Calcium: 9.4 mg/dL (ref 8.4–10.5)
Chloride: 98 mEq/L (ref 96–112)
Chloride: 94 mEq/L — ABNORMAL LOW (ref 96–112)
Creatinine, Ser: 0.7 mg/dL (ref 0.4–1.2)
GFR calc Af Amer: >60 mL/min (ref >60)
GFR calc non Af Amer: >60 mL/min (ref >60)
Glucose, Bld: 125 mg/dL — ABNORMAL HIGH (ref 70–99)
Glucose, Bld: 102 mg/dL — ABNORMAL HIGH (ref 70–99)
Potassium: 2.8 mEq/L — ABNORMAL LOW (ref 3.5–5.1)
Potassium: 3.4 mEq/L — ABNORMAL LOW (ref 3.5–5.1)
Sodium: 139 mEq/L (ref 135–145)
Sodium: 139 mEq/L (ref 135–145)

## 2010-06-10 LAB — URINE MICROSCOPIC-ADD ON

## 2010-06-10 LAB — CBC
HCT: 39.1 % (ref 36.0–46.0)
HCT: 35.3 % — ABNORMAL LOW (ref 36.0–46.0)
HCT: 45 % (ref 36.0–46.0)
Hemoglobin: 13.4 g/dL (ref 12.0–15.0)
Hemoglobin: 14.7 g/dL (ref 12.0–15.0)
MCH: 30.3 pg (ref 26.0–34.0)
MCHC: 34.3 g/dL (ref 30.0–36.0)
MCV: 88.5 fL (ref 78.0–100.0)
MCV: 94.3 fL (ref 78.0–100.0)
MCV: 94.4 fL (ref 78.0–100.0)
Platelets: 248 10*3/uL (ref 150–400)
Platelets: 231 10*3/uL (ref 150–400)
RBC: 4.42 MIL/uL (ref 3.87–5.11)
RBC: 3.74 MIL/uL — ABNORMAL LOW (ref 3.87–5.11)
RDW: 14.2 % (ref 11.5–15.5)
RDW: 14.2 % (ref 11.5–15.5)
WBC: 13.9 10*3/uL — ABNORMAL HIGH (ref 4.0–10.5)
WBC: 5 10*3/uL (ref 4.0–10.5)
WBC: 7.5 10*3/uL (ref 4.0–10.5)

## 2010-06-10 LAB — CULTURE, RESPIRATORY W GRAM STAIN
Culture: NORMAL
Gram Stain: NONE SEEN

## 2010-06-10 LAB — DIFFERENTIAL
Basophils Absolute: 0 10*3/uL (ref 0.0–0.1)
Eosinophils Relative: 0 % (ref 0–5)
Lymphocytes Relative: 10 % — ABNORMAL LOW (ref 12–46)
Lymphs Abs: 1.4 10*3/uL (ref 0.7–4.0)
Monocytes Relative: 12 % (ref 3–12)

## 2010-06-14 ENCOUNTER — Ambulatory Visit: Payer: PRIVATE HEALTH INSURANCE | Admitting: Internal Medicine

## 2010-06-19 ENCOUNTER — Telehealth: Payer: Self-pay | Admitting: Internal Medicine

## 2010-06-19 MED ORDER — NEBIVOLOL HCL 5 MG PO TABS: 5.0000 mg | ORAL_TABLET | Freq: Every day | ORAL | Status: DC

## 2010-06-19 NOTE — Telephone Encounter (Signed)
Spoke with pt's daughter. She is requesting refill for bystolic. Rx was sent to pharmacy.

## 2010-07-18 ENCOUNTER — Encounter: Payer: Self-pay | Admitting: Internal Medicine

## 2010-07-18 ENCOUNTER — Ambulatory Visit (INDEPENDENT_AMBULATORY_CARE_PROVIDER_SITE_OTHER): Payer: Medicare Other | Admitting: Internal Medicine

## 2010-07-18 DIAGNOSIS — I10 Essential (primary) hypertension: Secondary | ICD-10-CM

## 2010-07-18 DIAGNOSIS — R05 Cough: Secondary | ICD-10-CM

## 2010-07-18 DIAGNOSIS — J309 Allergic rhinitis, unspecified: Secondary | ICD-10-CM

## 2010-07-18 NOTE — Assessment & Plan Note (Signed)
Best choice for cough is 1st gen H1 if tolerates  See instructions for specific recommendations which were reviewed directly with the patient who was given a copy with highlighter outlining the key components.

## 2010-07-18 NOTE — Patient Instructions (Addendum)
Try off the allegra and take chlortrimeton 4 mg every 6 hours as needed for itching sneezy runny nose  Try symbicort 80 2 puffs every 12 hours if needed for wheeze or short of breath   If you are satisfied with your treatment plan let your doctor know and he/she can either refill your medications or you can return here when your prescription runs out.     If in any way you are not 100% satisfied,  please tell us.  If 100% better, tell your friends!

## 2010-07-18 NOTE — Assessment & Plan Note (Signed)
The most common causes of chronic cough in immunocompetent adults include the following: upper airway cough syndrome (UACS), previously referred to as postnasal drip syndrome (PNDS), which is caused by variety of rhinosinus conditions; (2) asthma; (3) GERD; (4) chronic bronchitis from cigarette smoking or other inhaled environmental irritants; (5) nonasthmatic eosinophilic bronchitis; and (6) bronchiectasis.   These conditions, singly or in combination, have accounted for up to 94% of the causes of chronic cough in prospective studies.   Other conditions have constituted no >6% of the causes in prospective studies These have included bronchogenic carcinoma, chronic interstitial pneumonia, sarcoidosis, left ventricular failure, ACEI-induced cough, and aspiration from a condition associated with pharyngeal dysfunction.   .Chronic cough is often simultaneously caused by more than one condition. A single cause has been found from 38 to 82% of the time, multiple causes from 18 to 62%. Multiply caused cough has been the result of three diseases up to 42% of the time.    Most likely this is a combination of pnds/uacs vs cough variant asthma.  See instructions for specific recommendations which were reviewed directly with the patient who was given a copy with highlighter outlining the key components.   The proper method of use, as well as anticipated side effects, of this metered-dose inhaler are discussed and demonstrated to the patient. This improved to 50% with coaching  Sinus ct next step

## 2010-07-18 NOTE — Progress Notes (Signed)
  Subjective:    Patient ID: Caitlin Richards, female    DOB: 1916-10-22, 77 y.o.   MRN: [id number]  HPI  Copy to: Dr. Joycelyn Rua       :  23 yowf never regular smoker bothered by "allergies and asthma all her life" under care of an allergist last well 2009 where only only needed an occasional allegra while living in [place].   April 19, 2010 1st pulmonary office eval ov new onset difficulty breathing and sense of nose and chest congestion indolent/ progressive but especially bad since October 2011. no better with allegra or spiriva then ended up in hospital with dx of pna and very large HH dx'd on ct with severe stricture on barium swallow rx as cap, botox rx for ? achalasia and swallowing better, still cough mostly dry assoc with nasal congestion but no purulent nasal secretions. imp was uacs vs asthma  rec stop metaprolol and fish oil   Bystolic 5mg  one daily in place of metaprolol   Prilosec 20mg  Take one 30-60 min before first and last meals of the day and Pepcid 20 mg one at bedtime  GERD (REFLUX) diet  ok restart spiriva if you feel you're loosing ground > did not stop it ? why   May 03, 2010 ov cc cough assoc with throat and nasal congestion in am using nasonex as needed in ams only. no purulent secretions. not limited by sob or having noct resp cos.   Stop spiriva, change bystolic and stop oils  07/18/2010  Ov/ Caitlin Richards :  Cc doe worse but cough much better off spirva. No purulent sputum or noct co's.  Pt denies any significant sore throat, dysphagia, itching, sneezing,  nasal congestion or excess/ purulent secretions,  fever, chills, sweats, unintended wt loss, pleuritic or exertional cp, hempoptysis, orthopnea pnd or leg swelling.    Also denies any obvious fluctuation of symptoms with weather or environmental changes or other aggravating or alleviating factors.      Past Medical History:  Esophageal dysfunction ? achalasia......................Marland KitchenHung  - EGD with botox  04/09/2010  Allergies/ asthma.....................................................Marland KitchenWert  HBP  - Changed metaprolol to bystolic Apr 19 2010 due to ? B2 effect             Review of Systems     Objective:   Physical Exam pleasant amb wf < stated age quite hoarse with classic pseudowheeze resolves completely with purse lip maneuver  wt 135 April 20, 2010 > 135 May 03, 2010 > 137 07/18/2010  HEENT mild turbinate edema. Oropharynx no thrush or excess pnd or cobblestoning. No JVD or cervical adenopathy. Mild accessory muscle hypertrophy. Trachea midline, nl thryroid. Chest was hyperinflated by percussion with diminished breath sounds and moderate increased exp time without wheeze. Hoover sign positive at mid inspiration. Regular rate and rhythm without murmur gallop or rub or increase P2 or edema. Abd: no hsm, nl excursion. Ext warm without cyanosis or clubbing.      cxr 05/03/10 1. Since 04/10/2010, chest x-ray resolution previous patchy  bilateral lower lobe airspace opacities (best visualized on CT  angio chest). Stable perihilar peribronchial cuffing of known  asthma.  2. Large hiatus hernia.  3. No interval acute findings.   Assessment & Plan:

## 2010-07-18 NOTE — Assessment & Plan Note (Signed)
Strongly prefer in this setting: Bystolic, the most beta -1  selective Beta blocker available in sample form, with bisoprolol the most selective generic choice  on the market.  

## 2010-08-03 ENCOUNTER — Emergency Department (HOSPITAL_BASED_OUTPATIENT_CLINIC_OR_DEPARTMENT_OTHER)
Admission: EM | Admit: 2010-08-03 | Discharge: 2010-08-03 | Disposition: A | Discharge status: Home / Self Care | Payer: Medicare Other | Attending: Emergency Medicine | Admitting: Emergency Medicine

## 2010-08-03 DIAGNOSIS — I2510 Atherosclerotic heart disease of native coronary artery without angina pectoris: Secondary | ICD-10-CM | POA: Insufficient documentation

## 2010-08-03 DIAGNOSIS — K219 Gastro-esophageal reflux disease without esophagitis: Secondary | ICD-10-CM | POA: Insufficient documentation

## 2010-08-03 DIAGNOSIS — I10 Essential (primary) hypertension: Secondary | ICD-10-CM | POA: Insufficient documentation

## 2010-08-03 DIAGNOSIS — R109 Unspecified abdominal pain: Secondary | ICD-10-CM | POA: Insufficient documentation

## 2010-08-03 DIAGNOSIS — E039 Hypothyroidism, unspecified: Secondary | ICD-10-CM | POA: Insufficient documentation

## 2010-08-03 LAB — CBC
HCT: 41.9 % (ref 36.0–46.0)
Hemoglobin: 14.2 g/dL (ref 12.0–15.0)
MCH: 30.7 pg (ref 26.0–34.0)
MCHC: 33.9 g/dL (ref 30.0–36.0)
MCV: 90.5 fL (ref 78.0–100.0)

## 2010-08-03 LAB — URINE MICROSCOPIC-ADD ON

## 2010-08-03 LAB — DIFFERENTIAL
Basophils Relative: 0 % (ref 0–1)
Lymphocytes Relative: 19 % (ref 12–46)
Lymphs Abs: 1.4 10*3/uL (ref 0.7–4.0)
Monocytes Absolute: 0.8 10*3/uL (ref 0.1–1.0)
Monocytes Relative: 11 % (ref 3–12)
Neutro Abs: 4.8 10*3/uL (ref 1.7–7.7)

## 2010-08-03 LAB — URINALYSIS, ROUTINE W REFLEX MICROSCOPIC
Bilirubin Urine: NEGATIVE
Glucose, UA: NEGATIVE mg/dL
Hgb urine dipstick: NEGATIVE
Ketones, ur: NEGATIVE mg/dL
Protein, ur: NEGATIVE mg/dL
pH: 6 (ref 5.0–8.0)

## 2010-08-03 LAB — COMPREHENSIVE METABOLIC PANEL
Albumin: 4.2 g/dL (ref 3.5–5.2)
Alkaline Phosphatase: 106 U/L (ref 39–117)
BUN: 14 mg/dL (ref 6–23)
Creatinine, Ser: 0.6 mg/dL (ref 0.4–1.2)
Glucose, Bld: 138 mg/dL — ABNORMAL HIGH (ref 70–99)
Total Bilirubin: 0.2 mg/dL — ABNORMAL LOW (ref 0.3–1.2)
Total Protein: 7.3 g/dL (ref 6.0–8.3)

## 2010-08-03 LAB — TROPONIN I: Troponin I: <0.3 ng/mL (ref <0.30)

## 2010-08-03 LAB — CK TOTAL AND CKMB (NOT AT ARMC)
CK, MB: 2.3 ng/mL (ref 0.3–4.0)
Relative Index: INVALID (ref 0.0–2.5)

## 2010-08-05 LAB — URINE CULTURE: Culture  Setup Time: 201205062232

## 2010-12-06 ENCOUNTER — Other Ambulatory Visit: Payer: Self-pay | Admitting: Internal Medicine

## 2011-01-24 ENCOUNTER — Emergency Department (HOSPITAL_COMMUNITY)
Admission: EM | Admit: 2011-01-24 | Discharge: 2011-01-24 | Disposition: A | Discharge status: Home / Self Care | Payer: Medicare Other | Attending: Emergency Medicine | Admitting: Emergency Medicine

## 2011-01-24 ENCOUNTER — Emergency Department (HOSPITAL_COMMUNITY): Payer: Medicare Other

## 2011-01-24 ENCOUNTER — Encounter (HOSPITAL_COMMUNITY): Payer: Self-pay | Admitting: Radiology

## 2011-01-24 DIAGNOSIS — F3289 Other specified depressive episodes: Secondary | ICD-10-CM | POA: Insufficient documentation

## 2011-01-24 DIAGNOSIS — R110 Nausea: Secondary | ICD-10-CM | POA: Insufficient documentation

## 2011-01-24 DIAGNOSIS — I10 Essential (primary) hypertension: Secondary | ICD-10-CM | POA: Insufficient documentation

## 2011-01-24 DIAGNOSIS — G20A1 Parkinson's disease without dyskinesia, without mention of fluctuations: Secondary | ICD-10-CM | POA: Insufficient documentation

## 2011-01-24 DIAGNOSIS — Z7982 Long term (current) use of aspirin: Secondary | ICD-10-CM | POA: Insufficient documentation

## 2011-01-24 DIAGNOSIS — R1013 Epigastric pain: Secondary | ICD-10-CM | POA: Insufficient documentation

## 2011-01-24 DIAGNOSIS — I4891 Unspecified atrial fibrillation: Secondary | ICD-10-CM | POA: Insufficient documentation

## 2011-01-24 DIAGNOSIS — F329 Major depressive disorder, single episode, unspecified: Secondary | ICD-10-CM | POA: Insufficient documentation

## 2011-01-24 DIAGNOSIS — K439 Ventral hernia without obstruction or gangrene: Secondary | ICD-10-CM | POA: Insufficient documentation

## 2011-01-24 DIAGNOSIS — R61 Generalized hyperhidrosis: Secondary | ICD-10-CM | POA: Insufficient documentation

## 2011-01-24 DIAGNOSIS — I2510 Atherosclerotic heart disease of native coronary artery without angina pectoris: Secondary | ICD-10-CM | POA: Insufficient documentation

## 2011-01-24 DIAGNOSIS — R51 Headache: Secondary | ICD-10-CM | POA: Insufficient documentation

## 2011-01-24 DIAGNOSIS — I499 Cardiac arrhythmia, unspecified: Secondary | ICD-10-CM | POA: Insufficient documentation

## 2011-01-24 DIAGNOSIS — Z79899 Other long term (current) drug therapy: Secondary | ICD-10-CM | POA: Insufficient documentation

## 2011-01-24 DIAGNOSIS — G20 Parkinson's disease: Secondary | ICD-10-CM | POA: Insufficient documentation

## 2011-01-24 DIAGNOSIS — E039 Hypothyroidism, unspecified: Secondary | ICD-10-CM | POA: Insufficient documentation

## 2011-01-24 HISTORY — DX: Unspecified abdominal hernia without obstruction or gangrene: K46.9

## 2011-01-24 HISTORY — DX: Hypothyroidism, unspecified: E03.9

## 2011-01-24 HISTORY — DX: Pneumonia, unspecified organism: J18.9

## 2011-01-24 HISTORY — DX: Parkinson's disease without dyskinesia, without mention of fluctuations: G20.A1

## 2011-01-24 HISTORY — DX: Parkinson's disease: G20

## 2011-01-24 HISTORY — DX: Diverticulitis of intestine, part unspecified, without perforation or abscess without bleeding: K57.92

## 2011-01-24 HISTORY — DX: Atherosclerotic heart disease of native coronary artery without angina pectoris: I25.10

## 2011-01-24 HISTORY — DX: Reserved for concepts with insufficient information to code with codable children: IMO0002

## 2011-01-24 HISTORY — DX: Acquired absence of other specified parts of digestive tract: Z90.49

## 2011-01-24 LAB — CBC
HCT: 46.4 % — ABNORMAL HIGH (ref 36.0–46.0)
Hemoglobin: 15.5 g/dL — ABNORMAL HIGH (ref 12.0–15.0)
MCH: 31.7 pg (ref 26.0–34.0)
MCV: 94.9 fL (ref 78.0–100.0)
RBC: 4.89 MIL/uL (ref 3.87–5.11)

## 2011-01-24 LAB — COMPREHENSIVE METABOLIC PANEL
Albumin: 3.9 g/dL (ref 3.5–5.2)
Alkaline Phosphatase: 83 U/L (ref 39–117)
BUN: 14 mg/dL (ref 6–23)
CO2: 29 mEq/L (ref 19–32)
Chloride: 104 mEq/L (ref 96–112)
GFR calc non Af Amer: 70 mL/min — ABNORMAL LOW (ref >90)
Glucose, Bld: 108 mg/dL — ABNORMAL HIGH (ref 70–99)
Potassium: 3.5 mEq/L (ref 3.5–5.1)
Total Bilirubin: 0.2 mg/dL — ABNORMAL LOW (ref 0.3–1.2)

## 2011-01-24 LAB — URINALYSIS, ROUTINE W REFLEX MICROSCOPIC
Bilirubin Urine: NEGATIVE
Ketones, ur: NEGATIVE mg/dL
Nitrite: NEGATIVE
pH: 6 (ref 5.0–8.0)

## 2011-01-24 LAB — POCT I-STAT TROPONIN I
Troponin i, poc: 0 ng/mL (ref 0.00–0.08)
Troponin i, poc: 0 ng/mL (ref 0.00–0.08)

## 2011-01-24 LAB — LIPASE, BLOOD: Lipase: 21 U/L (ref 11–59)

## 2011-01-24 LAB — URINE MICROSCOPIC-ADD ON

## 2011-01-24 MED ORDER — IOHEXOL 300 MG/ML  SOLN
100.0000 mL | Freq: Once | INTRAMUSCULAR | Status: AC | PRN
  Administered 2011-01-24: 100 mL via INTRAVENOUS

## 2011-06-02 ENCOUNTER — Other Ambulatory Visit: Payer: Self-pay | Admitting: Internal Medicine

## 2011-06-04 ENCOUNTER — Telehealth: Payer: Self-pay | Admitting: Internal Medicine

## 2011-06-04 ENCOUNTER — Other Ambulatory Visit: Payer: Self-pay | Admitting: Internal Medicine

## 2011-06-04 NOTE — Telephone Encounter (Signed)
The reason that rx was denied is b/c when the pt was last seen in April 2012, we advised that she should followup here prn and have her PCP refill meds, and this is for BP anyway and we have not been following her for that. She needs to contact PCP for this. Spoke with Paul Dykes at the pharmacy and made her aware of this and she verbalized understanding and states will contact PCP fr refills. Nothing further needed.

## 2011-09-16 ENCOUNTER — Other Ambulatory Visit: Payer: Self-pay | Admitting: Internal Medicine

## 2011-10-28 IMAGING — CT CT ANGIO CHEST
2 of 6 series · 19 of 36 positions shown · IV contrast (APPLIED)
Comparison: Chest x-ray dated 04/06/2010

CLINICAL DATA: Shortness of breath and cough.

CT ANGIOGRAPHY CHEST WITH CONTRAST
TECHNIQUE: Multidetector CT imaging of the chest was performed
using the standard protocol during bolus administration of
intravenous contrast.  Multiplanar CT image reconstructions
including MIPs were obtained to evaluate the vascular anatomy.
Contrast:  1 hour ccs [id number]

[Series 6: pe thins @ 1mm · axial · 0.63mm/px · z∈[-281,-16]mm · 18 of 295 slices shown]
[im 15/295  lung]
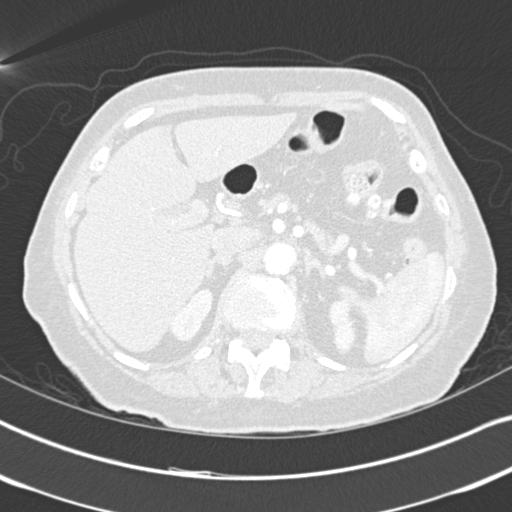
[im 30/295  mediastinal]
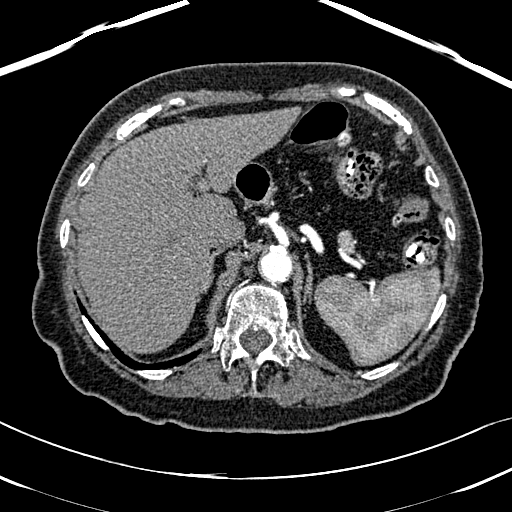
[im 45/295  lung]
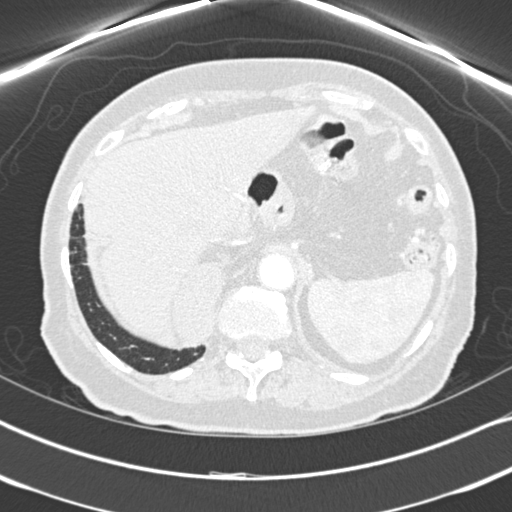
[im 59/295  mediastinal]
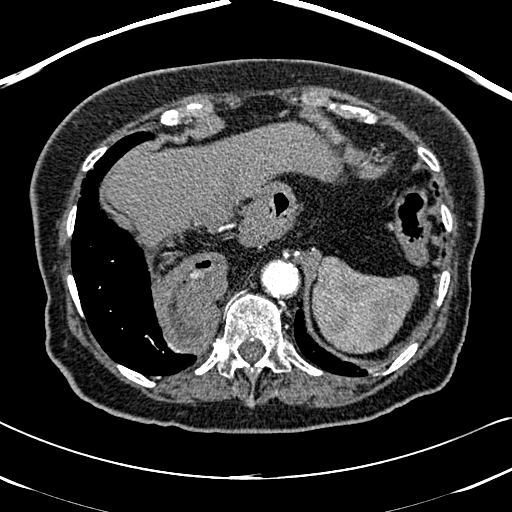
[im 74/295  lung]
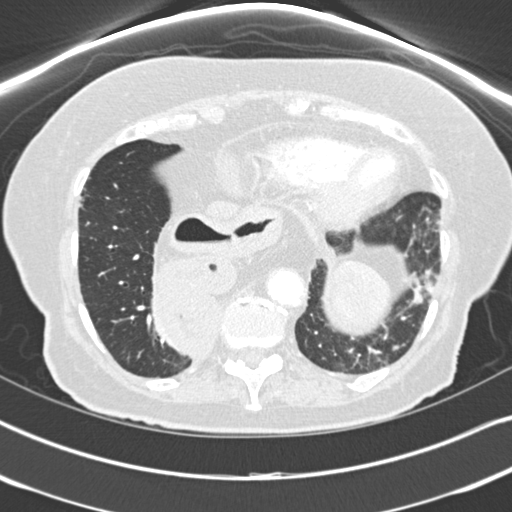
[im 89/295  mediastinal]
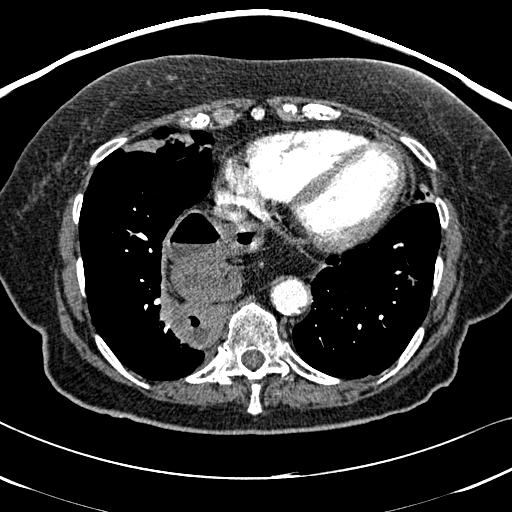
[im 103/295  lung]
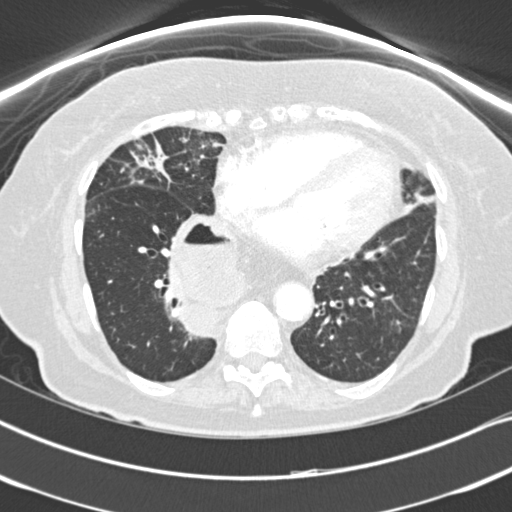
[im 118/295  mediastinal]
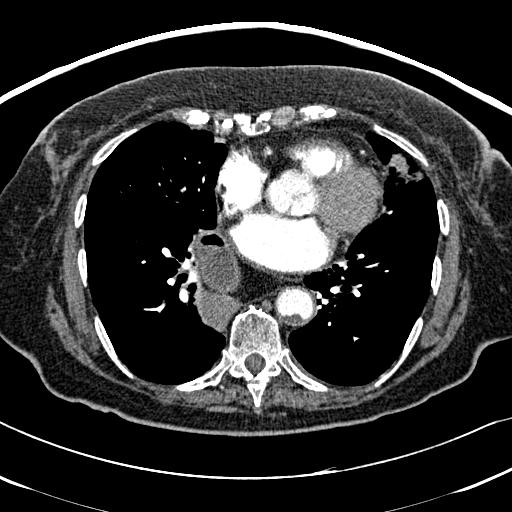
[im 133/295  lung]
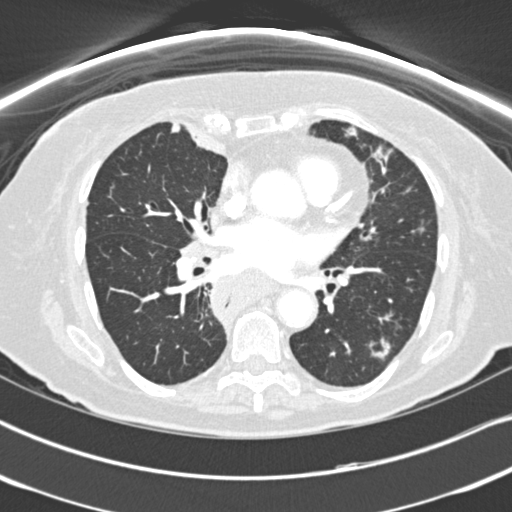
[im 162/295  mediastinal]
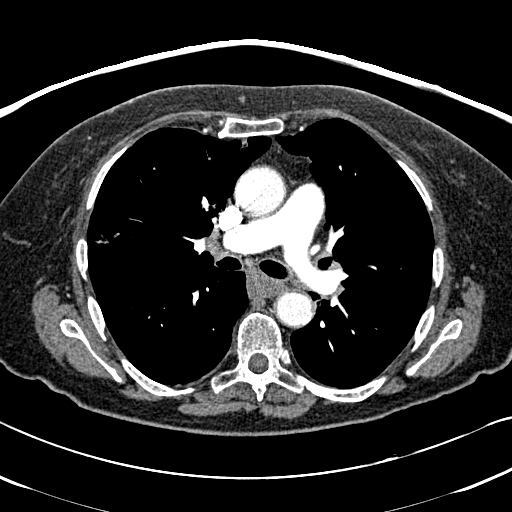
[im 177/295  lung]
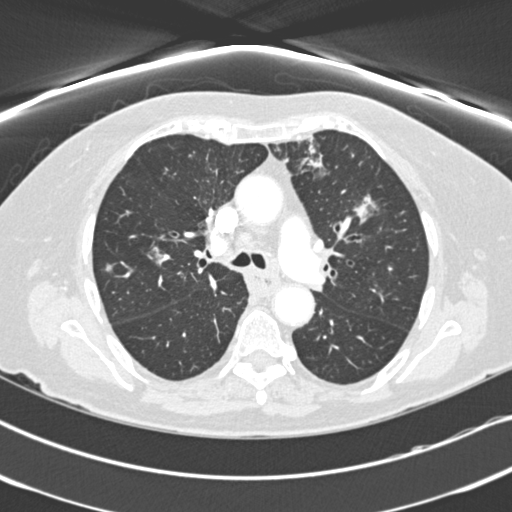
[im 192/295  mediastinal]
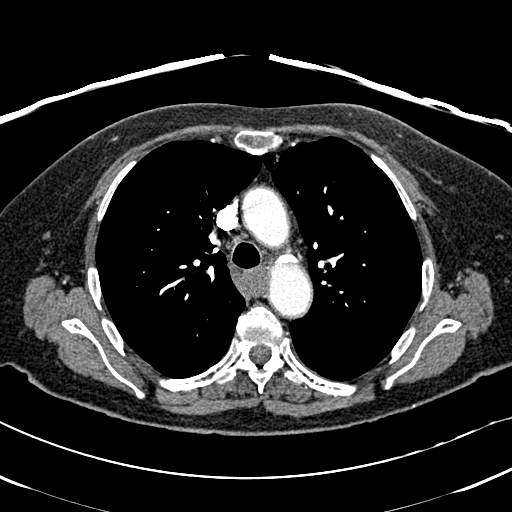
[im 206/295  lung]
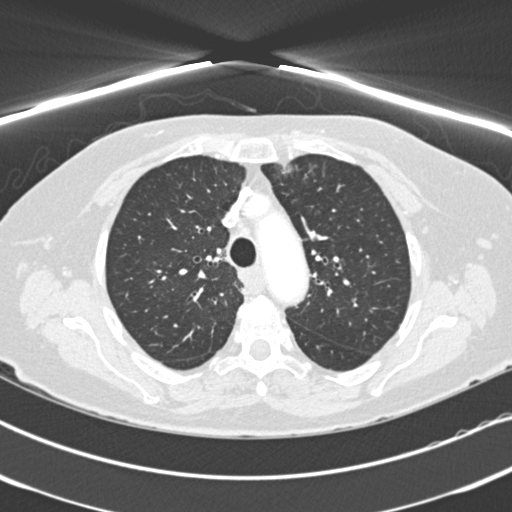
[im 221/295  mediastinal]
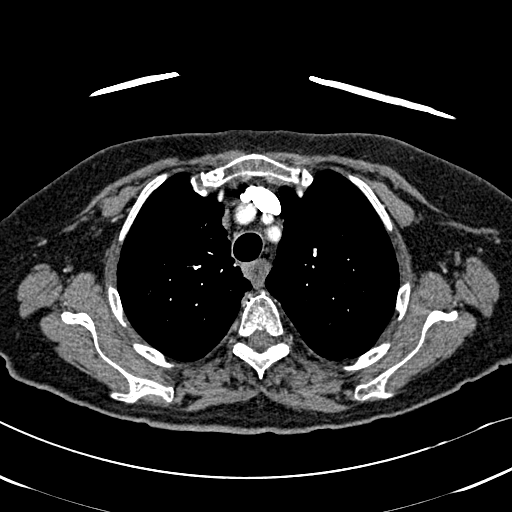
[im 236/295  lung]
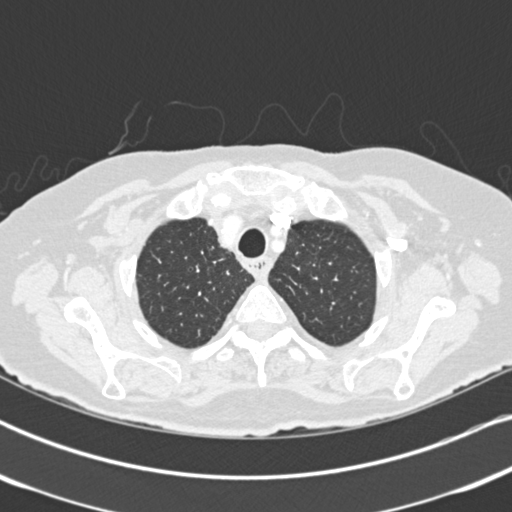
[im 250/295  mediastinal]
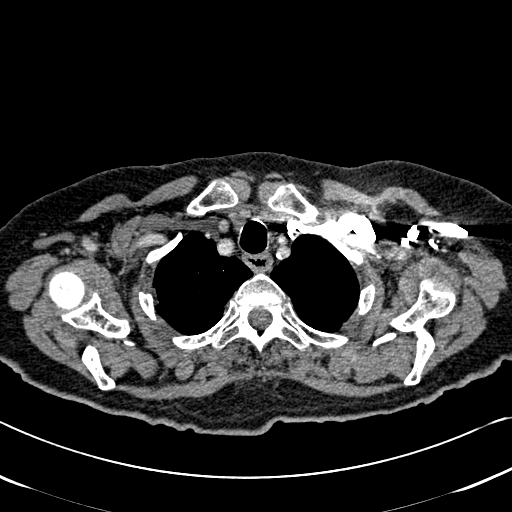
[im 265/295  lung]
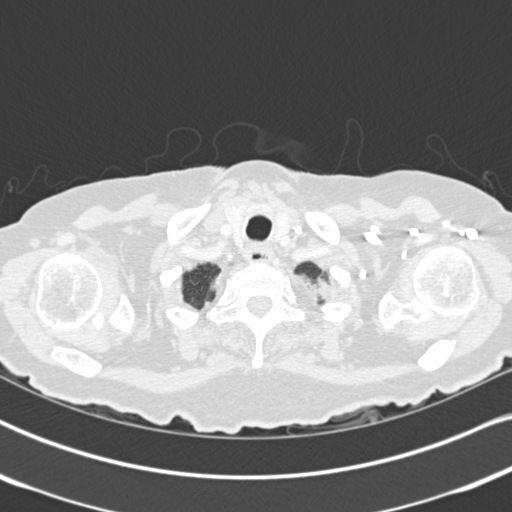
[im 280/295  mediastinal]
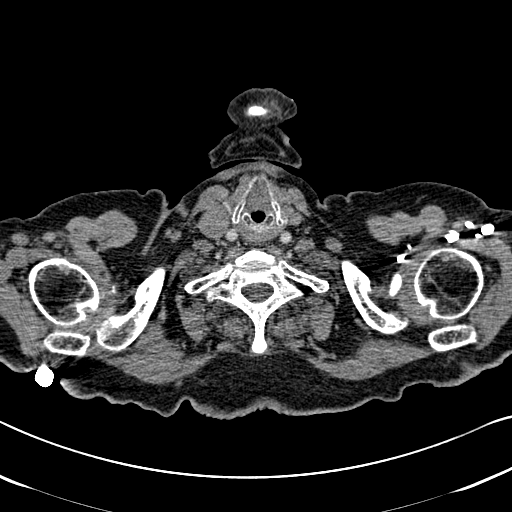

[Series 602: coronal mpr · coronal · 0.63mm/px · 1 of 118 slices shown]
[im 59/118  mediastinal]
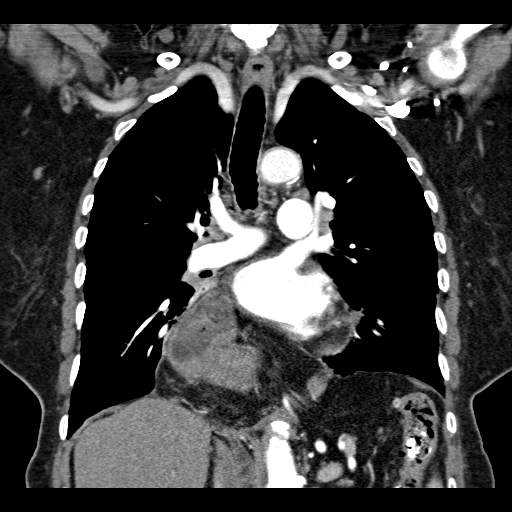

[19 of 36 positions shown; findings below may reference images not displayed]

FINDINGS: There are multiple patchy small infiltrates in both in
both upper lobes as well as in the left lower lobe posteriorly.
There is peribronchial thickening.  No significant adenopathy.  No
effusions.  Heart size is normal.

There is diffuse thickening of the esophageal mucosa.  Most of the
stomach is herniated into the chest.  The visualized portion of the
upper abdomen is otherwise normal.  No osseous abnormalities.

Review of the MIP images confirms the above findings.
IMPRESSION: 1.  Multiple small patchy infiltrates in both lungs.  The
possibility of fungal or atypical infection should be considered
although this could be patchy bacterial bronchopneumonia.
2.  Huge hiatal hernia.
3.  Diffuse thickening of the esophageal mucosa of unknown
etiology.  It could represent a chronic reflux esophagitis.

## 2012-06-30 ENCOUNTER — Other Ambulatory Visit: Payer: Self-pay | Admitting: Gastroenterology

## 2012-07-02 ENCOUNTER — Encounter (HOSPITAL_COMMUNITY): Payer: Self-pay

## 2012-07-02 ENCOUNTER — Ambulatory Visit (HOSPITAL_COMMUNITY)
Admission: RE | Admit: 2012-07-02 | Discharge: 2012-07-02 | Disposition: A | Discharge status: Home / Self Care | Payer: Medicare Other | Source: Ambulatory Visit | Attending: Gastroenterology | Admitting: Gastroenterology

## 2012-07-02 ENCOUNTER — Encounter (HOSPITAL_COMMUNITY)
Admission: RE | Disposition: A | Discharge status: Home / Self Care | Payer: Self-pay | Source: Ambulatory Visit | Attending: Gastroenterology

## 2012-07-02 DIAGNOSIS — G20A1 Parkinson's disease without dyskinesia, without mention of fluctuations: Secondary | ICD-10-CM | POA: Insufficient documentation

## 2012-07-02 DIAGNOSIS — K449 Diaphragmatic hernia without obstruction or gangrene: Secondary | ICD-10-CM | POA: Insufficient documentation

## 2012-07-02 DIAGNOSIS — E039 Hypothyroidism, unspecified: Secondary | ICD-10-CM | POA: Insufficient documentation

## 2012-07-02 DIAGNOSIS — I2510 Atherosclerotic heart disease of native coronary artery without angina pectoris: Secondary | ICD-10-CM | POA: Insufficient documentation

## 2012-07-02 DIAGNOSIS — I10 Essential (primary) hypertension: Secondary | ICD-10-CM | POA: Insufficient documentation

## 2012-07-02 DIAGNOSIS — G20 Parkinson's disease: Secondary | ICD-10-CM | POA: Insufficient documentation

## 2012-07-02 DIAGNOSIS — K220 Achalasia of cardia: Secondary | ICD-10-CM | POA: Insufficient documentation

## 2012-07-02 HISTORY — PX: ESOPHAGOGASTRODUODENOSCOPY: SHX5428

## 2012-07-02 HISTORY — DX: Presence of coronary angioplasty implant and graft: Z95.5

## 2012-07-02 HISTORY — PX: BOTOX INJECTION: SHX5754

## 2012-07-02 SURGERY — EGD (ESOPHAGOGASTRODUODENOSCOPY)
Anesthesia: Moderate Sedation

## 2012-07-02 MED ORDER — SODIUM CHLORIDE 0.9 % IJ SOLN
100.0000 [IU] | Freq: Once | INTRAMUSCULAR | Status: AC
  Administered 2012-07-02: 100 [IU] via SUBMUCOSAL
  Filled 2012-07-02: qty 100

## 2012-07-02 MED ORDER — SODIUM CHLORIDE 0.9 % IV SOLN
INTRAVENOUS | Status: DC
  Administered 2012-07-02: 500 mL via INTRAVENOUS

## 2012-07-02 MED ORDER — MIDAZOLAM HCL 10 MG/2ML IJ SOLN
INTRAMUSCULAR | Status: DC | PRN
  Administered 2012-07-02 (×2): 2 mg via INTRAVENOUS

## 2012-07-02 MED ORDER — DIPHENHYDRAMINE HCL 50 MG/ML IJ SOLN
INTRAMUSCULAR | Status: AC
  Filled 2012-07-02: qty 1

## 2012-07-02 MED ORDER — MIDAZOLAM HCL 10 MG/2ML IJ SOLN
INTRAMUSCULAR | Status: AC
  Filled 2012-07-02: qty 2

## 2012-07-02 MED ORDER — FENTANYL CITRATE 0.05 MG/ML IJ SOLN
INTRAMUSCULAR | Status: AC
  Filled 2012-07-02: qty 2

## 2012-07-02 MED ORDER — FENTANYL CITRATE 0.05 MG/ML IJ SOLN
INTRAMUSCULAR | Status: DC | PRN
  Administered 2012-07-02 (×2): 25 ug via INTRAVENOUS

## 2012-07-02 NOTE — H&P (Signed)
Reason for Consult:Achalasia  Caitlin Richards HPI: This is a 79 year old female without a PMH of Achalasia who reports a recurrence of her dysphagia symptoms.  Her symptoms returned two months ago and it has been progressive.  Two years ago she underwent Botox injection with an excellent response.  There was only radiographic evidence, i.e., bird's beak, on the esophagram that made the diagnosis.  She was not able to tolerate the manometry.  She is here today to undergo a repeat Botox injection.  Past Medical History  Diagnosis Date  . Hypertension   . Asthma   . Esophageal dysfunction   . CAD (coronary artery disease)   . Hypothyroid   . Parkinson disease   . Pneumonia   . Dysphagia   . S/P appy   . Hx of cholecystectomy   . Hx of oophorectomy   . Hernia   . Diverticulitis   . S/P coronary artery stent placement     LAD    Past Surgical History  Procedure Laterality Date  . Appendectomy  1934  . Tonsillectomy and adenoidectomy  1925  . Hernia repair  1978  . Knee surgery  1992    rt knee  . Coronary angioplasty with stent placement  2001  . Total abdominal hysterectomy w/ bilateral salpingoophorectomy  2006  . Cataract surgery  2006  . Laminectomy  2009    L4,L5  . Exc basal cell ca on head    . Lumbar laminectomy      Family History  Problem Relation Age of Onset  . Asthma Father   . Asthma Paternal Grandmother     Social History:  reports that she has never smoked. She does not have any smokeless tobacco history on file. She reports that she does not drink alcohol. Her drug history is not on file.  Allergies:  Allergies  Allergen Reactions  . Clopidogrel Bisulfate     REACTION: shakes  . Hydralazine   . Lisinopril     REACTION: respitory issues  . Oxycodone-Acetaminophen     REACTION: anaphylaxis  . Sulfonamide Derivatives     REACTION: unknown    Medications:  Scheduled: . botox 100 units/NS 4 mL for final conc. 25 units/mL mixture  100 Units  Submucosal Once   Continuous: . sodium chloride      No results found for this or any previous visit (from the past 24 hour(s)).   No results found.  ROS:  As stated above in the HPI otherwise negative.  Blood pressure 153/75, temperature 97.6 F (36.4 C), temperature source Oral, resp. rate 18, SpO2 97.00%.    PE: Gen: NAD, Alert and Oriented HEENT:  Caitlin Richards/AT, EOMI Neck: Supple, no LAD Lungs: CTA Bilaterally CV: RRR without M/G/R ABM: Soft, NTND, +BS Ext: No C/C/E  Assessment/Plan: 1) Achalasia.  Plan: 1) Repeat Botox injection.  Caitlin Richards D 07/02/2012, 8:32 AM

## 2012-07-05 ENCOUNTER — Encounter (HOSPITAL_COMMUNITY): Payer: Self-pay | Admitting: Gastroenterology

## 2012-07-05 ENCOUNTER — Other Ambulatory Visit: Payer: Self-pay | Admitting: Gastroenterology

## 2012-07-05 DIAGNOSIS — R1310 Dysphagia, unspecified: Secondary | ICD-10-CM

## 2012-07-16 ENCOUNTER — Other Ambulatory Visit: Payer: Medicare Other

## 2012-07-27 ENCOUNTER — Ambulatory Visit
Admission: RE | Admit: 2012-07-27 | Discharge: 2012-07-27 | Disposition: A | Discharge status: Home / Self Care | Payer: Medicare Other | Source: Ambulatory Visit | Attending: Gastroenterology | Admitting: Gastroenterology

## 2012-07-27 ENCOUNTER — Other Ambulatory Visit: Payer: Self-pay | Admitting: Gastroenterology

## 2012-07-27 DIAGNOSIS — R1310 Dysphagia, unspecified: Secondary | ICD-10-CM

## 2012-08-09 ENCOUNTER — Encounter (INDEPENDENT_AMBULATORY_CARE_PROVIDER_SITE_OTHER): Payer: Self-pay | Admitting: General Surgery

## 2012-08-09 ENCOUNTER — Ambulatory Visit (INDEPENDENT_AMBULATORY_CARE_PROVIDER_SITE_OTHER): Payer: Medicare Other | Admitting: General Surgery

## 2012-08-09 VITALS — BP 128/70 | HR 60 | Temp 97.3°F | Resp 20 | Ht 62.0 in | Wt 126.2 lb

## 2012-08-09 DIAGNOSIS — K4090 Unilateral inguinal hernia, without obstruction or gangrene, not specified as recurrent: Secondary | ICD-10-CM

## 2012-08-09 NOTE — Progress Notes (Signed)
Subjective:     Patient ID: Caitlin Richards, female   DOB: January 26, 1917, 79 y.o.   MRN: [id number]  HPI We're asked to see the patient in consultation by Dr. Elnoria Howard to evaluate her for a right inguinal hernia. The patient is a 79 year old white female who had a barium swallow about 2 years ago. It caused her such bad constipation that she strained very hard to have a bowel movement and developed a bulge in her right groin. Since then she has had some intermittent pain and constipation associated with it. She denies any nausea or vomiting.  Review of Systems  Constitutional: Negative.   HENT: Negative.   Eyes: Negative.   Respiratory: Negative.   Cardiovascular: Negative.   Gastrointestinal: Negative.   Endocrine: Negative.   Genitourinary: Negative.   Musculoskeletal: Negative.   Skin: Negative.   Allergic/Immunologic: Negative.   Neurological: Negative.   Hematological: Negative.   Psychiatric/Behavioral: Negative.        Objective:   Physical Exam  Constitutional: She is oriented to person, place, and time. She appears well-developed and well-nourished.  HENT:  Head: Normocephalic and atraumatic.  Eyes: Conjunctivae and EOM are normal. Pupils are equal, round, and reactive to light.  Neck: Normal range of motion. Neck supple.  Cardiovascular: Normal rate, regular rhythm and normal heart sounds.   Pulmonary/Chest: Effort normal and breath sounds normal.  Abdominal: Soft. Bowel sounds are normal.  Genitourinary:  There is a moderate-sized bulge in the right groin that reduces easily  Musculoskeletal: Normal range of motion.  Neurological: She is alert and oriented to person, place, and time.  Skin: Skin is warm and dry.  Psychiatric: She has a normal mood and affect. Her behavior is normal.       Assessment:     The patient appears to have a moderate-sized symptomatic right inguinal hernia. Because of the risk of incarceration and strangulation I think she would benefit from  having a hernia fixed. She would also like to have this done. I have discussed with her in detail the risks and benefits of the operation to fix the hernia as well as some of the technical aspects including the use of mesh and the risk of infection and she understands and wishes to proceed. She does have some cardiac history and sees Dr. Anne Fu in cardiology.    Plan:     Plan for right inguinal hernia repair with mesh once we have cardiac clearance

## 2012-09-06 ENCOUNTER — Telehealth: Payer: Self-pay | Admitting: Neurology

## 2012-09-06 NOTE — Telephone Encounter (Signed)
Called and spoke to patients daughter and she thinks  Carbidopa- Levodopa 10-100mg  2 in the am, 2 at lunch and 2 at eight pm. could RX give her a rash after being on on the RX since 2012. I took her to Dermatologist last week and he thinks its her nerves. He told her to get a cream over the counter for her itching and we did that. Daughter was reading and carbidopa -levodopa can rash.   Rash is on her back leading down to her left breast. Please advise.

## 2012-09-06 NOTE — Telephone Encounter (Signed)
Called and spoke to patient daughter and told her Caitlin Richards thinks not carbo dopa  At this point to follow up with her dermatologists. Patient daughter understood.

## 2012-09-06 NOTE — Telephone Encounter (Signed)
I do not think it is coming from carbodopa after this period of time. Sounds like contact dermatitis. Go by the dermatologists instructions.

## 2012-09-06 NOTE — Telephone Encounter (Signed)
Patient's daughter is calling to tell us the patient is having a severe body rash, which they think is from the Carbidopa-levodopa recently started.  This is red and itching badly!  Please call asap

## 2012-09-20 ENCOUNTER — Other Ambulatory Visit (INDEPENDENT_AMBULATORY_CARE_PROVIDER_SITE_OTHER): Payer: Self-pay | Admitting: General Surgery

## 2012-09-21 ENCOUNTER — Telehealth (INDEPENDENT_AMBULATORY_CARE_PROVIDER_SITE_OTHER): Payer: Self-pay

## 2012-09-21 ENCOUNTER — Encounter (INDEPENDENT_AMBULATORY_CARE_PROVIDER_SITE_OTHER): Payer: Self-pay

## 2012-09-21 NOTE — Telephone Encounter (Signed)
LMOM. I called to let her know I received her clearance and we sent orders to our schedulers. I also want to remind her to stop her asa 5 days before surgery.

## 2012-09-23 ENCOUNTER — Encounter (HOSPITAL_COMMUNITY): Payer: Self-pay

## 2012-09-28 ENCOUNTER — Ambulatory Visit (HOSPITAL_COMMUNITY)
Admission: RE | Admit: 2012-09-28 | Discharge: 2012-09-28 | Disposition: A | Discharge status: Home / Self Care | Payer: Medicare Other | Source: Ambulatory Visit | Attending: Anesthesiology | Admitting: Anesthesiology

## 2012-09-28 ENCOUNTER — Encounter (HOSPITAL_COMMUNITY): Payer: Self-pay

## 2012-09-28 ENCOUNTER — Encounter (HOSPITAL_COMMUNITY)
Admission: RE | Admit: 2012-09-28 | Discharge: 2012-09-28 | Disposition: A | Discharge status: Home / Self Care | Payer: Medicare Other | Source: Ambulatory Visit | Attending: General Surgery | Admitting: General Surgery

## 2012-09-28 DIAGNOSIS — Z01818 Encounter for other preprocedural examination: Secondary | ICD-10-CM | POA: Insufficient documentation

## 2012-09-28 DIAGNOSIS — I10 Essential (primary) hypertension: Secondary | ICD-10-CM | POA: Insufficient documentation

## 2012-09-28 DIAGNOSIS — I2510 Atherosclerotic heart disease of native coronary artery without angina pectoris: Secondary | ICD-10-CM | POA: Insufficient documentation

## 2012-09-28 DIAGNOSIS — Z01812 Encounter for preprocedural laboratory examination: Secondary | ICD-10-CM | POA: Insufficient documentation

## 2012-09-28 DIAGNOSIS — I700 Atherosclerosis of aorta: Secondary | ICD-10-CM | POA: Insufficient documentation

## 2012-09-28 HISTORY — DX: Unspecified macular degeneration: H35.30

## 2012-09-28 HISTORY — DX: Malignant (primary) neoplasm, unspecified: C80.1

## 2012-09-28 HISTORY — DX: Gastro-esophageal reflux disease without esophagitis: K21.9

## 2012-09-28 HISTORY — DX: Disease of esophagus, unspecified: K22.9

## 2012-09-28 HISTORY — DX: Anxiety disorder, unspecified: F41.9

## 2012-09-28 HISTORY — DX: Personal history of urinary (tract) infections: Z87.440

## 2012-09-28 HISTORY — DX: Rash and other nonspecific skin eruption: R21

## 2012-09-28 HISTORY — DX: Unspecified osteoarthritis, unspecified site: M19.90

## 2012-09-28 HISTORY — DX: Personal history of other diseases of the digestive system: Z87.19

## 2012-09-28 HISTORY — DX: Unspecified hearing loss, unspecified ear: H91.90

## 2012-09-28 HISTORY — DX: Personal history of other medical treatment: Z92.89

## 2012-09-28 HISTORY — DX: Shortness of breath: R06.02

## 2012-09-28 LAB — BASIC METABOLIC PANEL
Calcium: 9.9 mg/dL (ref 8.4–10.5)
GFR calc Af Amer: 87 mL/min — ABNORMAL LOW (ref >90)
GFR calc non Af Amer: 75 mL/min — ABNORMAL LOW (ref >90)
Glucose, Bld: 149 mg/dL — ABNORMAL HIGH (ref 70–99)
Potassium: 3.4 mEq/L — ABNORMAL LOW (ref 3.5–5.1)
Sodium: 138 mEq/L (ref 135–145)

## 2012-09-28 LAB — CBC
MCH: 31.5 pg (ref 26.0–34.0)
Platelets: 175 10*3/uL (ref 150–400)
RBC: 5.01 MIL/uL (ref 3.87–5.11)
WBC: 6.2 10*3/uL (ref 4.0–10.5)

## 2012-09-28 NOTE — Pre-Procedure Instructions (Addendum)
Caitlin Richards  09/28/2012   Your procedure is scheduled on:  Monday, July 7th  Report to [facility] at  5:30AM.            Main Entrance to [facility] to 3rd Floor- Short Stay.  Call this number if you have problems the morning of surgery: [phone]   Remember:   Do not eat food or drink liquids after midnight.   Take these medicines the morning of surgery with A SIP OF WATER:carbidopa-levodopa (SINEMET), fexofenadine (ALLEGRA)evothyroxine (SYNTHROID, LEVOTHROID),  omeprazole (PRILOSEC).  May use Nasonex.  Stop taking Aspirin, Coumadin, Plavix, Effient and Herbal medications.  Do not take any NSAIDs ie: Ibuprofen,  Advil,Naproxen or any medication containing Aspirin.  Stop Eliquis 2 days prior.      Do not wear jewelry, make-up or nail polish.  Do not wear lotions, powders, or perfumes. You may wear deodorant.  Do not shave 48 hours prior to surgery.   Do not bring valuables to the hospital.  [facility] is not responsible for any belongings or valuables.  Contacts, dentures or bridgework may not be worn into surgery.  Leave suitcase in the car. After surgery it may be brought to your room.  For patients admitted to the hospital, checkout time is 11:00 AM the day of discharge.   Patients discharged the day of surgery will not be allowed to drive home.  Name and phone number of your driver:    Special Instructions: Shower using CHG 2 nights before surgery and the night before surgery.  If you shower the day of surgery use CHG.  Use special wash - you have one bottle of CHG for all showers.  You should use approximately 1/3 of the bottle for each shower.   Please read over the following fact sheets that you were given: Pain Booklet, Coughing and Deep Breathing and Surgical Site Infection Prevention

## 2012-09-28 NOTE — Progress Notes (Signed)
Pts cardiologist is Dr Anne Fu-, she saw him in the past 2 months, I requested EKG and last office note from Dr Anne Fu office.

## 2012-09-29 NOTE — Progress Notes (Signed)
Anesthesia Chart Review:  Patient is a 79 year old female scheduled for right ingunial hernia repair on 7/70/14 by Dr. Carolynne Edouard.  History includes afib, HTN, CAD s/p stent '01, asthma, dysphagia, diverticulitis, GERD/esophageal dysfunction (treated with Botox),hiatal hernia, Parkinson disease, anxiety, hypothyroidism, skin cancer, PNA '12, hearing loss, macular degeneration.  PCP is Dr. Joycelyn Rua.  Cardiologist is Dr. Donato Schultz.  He last saw her on 09/17/12 for follow-up and preoperative evaluation.  His note indicates that she had a normal nuclear stress test, EF 71% on 05/12/08.  There was no mention of an echo, but I asked [facility] to fax a copy of one if available.  Dr. Anne Fu felt she could proceed with hernia repair at mild to moderate risk based mostly on her age.  He instructed her to hold Eliquis for 2 days prior to surgery.  Echo on 08/25/12 showed normal LV size and function, LVEF estimated at 60-65%, no regional wall motion abnormalities, moderate mitral annular calcification, mild mitral valve regurgitation, mild tricuspid regurgitation, mildly elevated estimated RVSP, mild calcification of aortic valve, no aortic valve stenosis, Doppler findings suggestive of grade 1 diastolic dysfunction without elevated left atrial pressure, atrial fibrillation.  EKG on 08/25/12 from [facility] showed afib/flutter @ 44 bpm, non-specific T wave abnornmality.  (HR was 69 bpm at PAT.)  CXR report on 09/28/12 showed: Findings: Cardiac silhouette difficult to assess for sized given overlying air-fluid densities created by extremely large hiatal hernia. Allowing for this, heart size likely only mildly enlarged. The vascular pattern is normal. There is mild interstitial prominence in the bilateral mid to lower lung zones without evidence of pulmonary edema. There is a mildly nodular component to this process, with the largest nodule seen in the right mid lung zone measuring about 5 mm. There is  calcification of the thoracic aorta. This report has already been reviewed by Dr. Carolynne Edouard.  In the absence of any acute respiratory symptoms, would defer further evaluation recommendations to Dr. Carolynne Edouard.  Preoperative labs noted.    She will be evaluated by her assigned anesthesiologist on the day of surgery.  If no acute changes in her cardiopulmonary status then I would anticipate that she could proceed as planned.  Velna Ochs [facility] Short Stay Center/Anesthesiology Phone [phone] 09/29/2012 5:17 PM

## 2012-10-03 MED ORDER — CEFAZOLIN SODIUM-DEXTROSE 2-3 GM-% IV SOLR
2.0000 g | INTRAVENOUS | Status: AC
  Administered 2012-10-04: 2 g via INTRAVENOUS
  Filled 2012-10-03: qty 50

## 2012-10-04 ENCOUNTER — Encounter (HOSPITAL_COMMUNITY)
Admission: RE | Disposition: A | Discharge status: Home / Self Care | Payer: Self-pay | Source: Ambulatory Visit | Attending: General Surgery

## 2012-10-04 ENCOUNTER — Encounter (HOSPITAL_COMMUNITY): Payer: Self-pay | Admitting: Vascular Surgery

## 2012-10-04 ENCOUNTER — Ambulatory Visit (HOSPITAL_COMMUNITY): Payer: Medicare Other | Admitting: Anesthesiology

## 2012-10-04 ENCOUNTER — Observation Stay (HOSPITAL_COMMUNITY)
Admission: RE | Admit: 2012-10-04 | Discharge: 2012-10-05 | Disposition: A | Discharge status: Home / Self Care | Payer: Medicare Other | Source: Ambulatory Visit | Attending: General Surgery | Admitting: General Surgery

## 2012-10-04 ENCOUNTER — Encounter (HOSPITAL_COMMUNITY): Payer: Self-pay | Admitting: Anesthesiology

## 2012-10-04 DIAGNOSIS — M549 Dorsalgia, unspecified: Secondary | ICD-10-CM | POA: Insufficient documentation

## 2012-10-04 DIAGNOSIS — K4090 Unilateral inguinal hernia, without obstruction or gangrene, not specified as recurrent: Principal | ICD-10-CM | POA: Insufficient documentation

## 2012-10-04 DIAGNOSIS — G8929 Other chronic pain: Secondary | ICD-10-CM | POA: Insufficient documentation

## 2012-10-04 DIAGNOSIS — I10 Essential (primary) hypertension: Secondary | ICD-10-CM | POA: Insufficient documentation

## 2012-10-04 DIAGNOSIS — Z79899 Other long term (current) drug therapy: Secondary | ICD-10-CM | POA: Insufficient documentation

## 2012-10-04 HISTORY — PX: INSERTION OF MESH: SHX5868

## 2012-10-04 HISTORY — PX: INGUINAL HERNIA REPAIR: SHX194

## 2012-10-04 SURGERY — REPAIR, HERNIA, INGUINAL, ADULT
Anesthesia: General | Site: Groin | Laterality: Right | Wound class: Clean

## 2012-10-04 MED ORDER — FLUTICASONE PROPIONATE 50 MCG/ACT NA SUSP
1.0000 | Freq: Every day | NASAL | Status: DC
  Administered 2012-10-05: 1 via NASAL
  Filled 2012-10-04: qty 16

## 2012-10-04 MED ORDER — TRAMADOL HCL 50 MG PO TABS
50.0000 mg | ORAL_TABLET | Freq: Four times a day (QID) | ORAL | Status: DC
  Administered 2012-10-04 – 2012-10-05 (×4): 50 mg via ORAL
  Filled 2012-10-04 (×8): qty 1

## 2012-10-04 MED ORDER — POLYVINYL ALCOHOL 1.4 % OP SOLN
1.0000 [drp] | Freq: Two times a day (BID) | OPHTHALMIC | Status: DC | PRN
  Filled 2012-10-04: qty 15

## 2012-10-04 MED ORDER — LORATADINE 10 MG PO TABS: 10.0000 mg | ORAL_TABLET | Freq: Every day | ORAL | Status: DC

## 2012-10-04 MED ORDER — BUPIVACAINE-EPINEPHRINE PF 0.25-1:200000 % IJ SOLN
INTRAMUSCULAR | Status: AC
  Filled 2012-10-04: qty 30

## 2012-10-04 MED ORDER — MORPHINE SULFATE 2 MG/ML IJ SOLN: 2.0000 mg | INTRAMUSCULAR | Status: DC | PRN

## 2012-10-04 MED ORDER — NEOSTIGMINE METHYLSULFATE 1 MG/ML IJ SOLN
INTRAMUSCULAR | Status: DC | PRN
  Administered 2012-10-04: 3 mg via INTRAVENOUS

## 2012-10-04 MED ORDER — KCL IN DEXTROSE-NACL 20-5-0.9 MEQ/L-%-% IV SOLN
INTRAVENOUS | Status: DC
  Administered 2012-10-05: 07:00:00 via INTRAVENOUS
  Filled 2012-10-04 (×2): qty 1000

## 2012-10-04 MED ORDER — 0.9 % SODIUM CHLORIDE (POUR BTL) OPTIME
TOPICAL | Status: DC | PRN
  Administered 2012-10-04: 1000 mL

## 2012-10-04 MED ORDER — ALPRAZOLAM 0.25 MG PO TABS: 0.2500 mg | ORAL_TABLET | Freq: Every evening | ORAL | Status: DC | PRN

## 2012-10-04 MED ORDER — GLYCOPYRROLATE 0.2 MG/ML IJ SOLN
INTRAMUSCULAR | Status: DC | PRN
  Administered 2012-10-04: 0.4 mg via INTRAVENOUS

## 2012-10-04 MED ORDER — ONDANSETRON HCL 4 MG PO TABS: 4.0000 mg | ORAL_TABLET | Freq: Four times a day (QID) | ORAL | Status: DC | PRN

## 2012-10-04 MED ORDER — ONDANSETRON HCL 4 MG/2ML IJ SOLN
INTRAMUSCULAR | Status: DC | PRN
  Administered 2012-10-04: 4 mg via INTRAVENOUS

## 2012-10-04 MED ORDER — BUPIVACAINE-EPINEPHRINE 0.25% -1:200000 IJ SOLN
INTRAMUSCULAR | Status: DC | PRN
  Administered 2012-10-04: 30 mL

## 2012-10-04 MED ORDER — CARBIDOPA-LEVODOPA 10-100 MG PO TABS
2.0000 | ORAL_TABLET | Freq: Three times a day (TID) | ORAL | Status: DC
  Administered 2012-10-04 – 2012-10-05 (×3): 2 via ORAL
  Filled 2012-10-04 (×6): qty 2

## 2012-10-04 MED ORDER — ONDANSETRON HCL 4 MG/2ML IJ SOLN: 4.0000 mg | Freq: Four times a day (QID) | INTRAMUSCULAR | Status: DC | PRN

## 2012-10-04 MED ORDER — ARTIFICIAL TEARS OP OINT
TOPICAL_OINTMENT | OPHTHALMIC | Status: DC | PRN
  Administered 2012-10-04: 1 via OPHTHALMIC

## 2012-10-04 MED ORDER — LORATADINE 10 MG PO TABS
10.0000 mg | ORAL_TABLET | Freq: Every day | ORAL | Status: DC
  Administered 2012-10-05: 10 mg via ORAL
  Filled 2012-10-04: qty 1

## 2012-10-04 MED ORDER — FENTANYL CITRATE 0.05 MG/ML IJ SOLN
25.0000 ug | INTRAMUSCULAR | Status: DC | PRN
  Administered 2012-10-04: 50 ug via INTRAVENOUS

## 2012-10-04 MED ORDER — HYDROCHLOROTHIAZIDE 12.5 MG PO CAPS
12.5000 mg | ORAL_CAPSULE | Freq: Every day | ORAL | Status: DC
  Administered 2012-10-04 – 2012-10-05 (×2): 12.5 mg via ORAL
  Filled 2012-10-04 (×2): qty 1

## 2012-10-04 MED ORDER — BISACODYL 5 MG PO TBEC
10.0000 mg | DELAYED_RELEASE_TABLET | Freq: Every evening | ORAL | Status: DC
  Administered 2012-10-04: 10 mg via ORAL
  Filled 2012-10-04 (×2): qty 2

## 2012-10-04 MED ORDER — CHLORHEXIDINE GLUCONATE 4 % EX LIQD: 1.0000 "application " | Freq: Once | CUTANEOUS | Status: DC

## 2012-10-04 MED ORDER — HYDROCODONE-ACETAMINOPHEN 5-325 MG PO TABS: 1.0000 | ORAL_TABLET | ORAL | Status: DC | PRN

## 2012-10-04 MED ORDER — BUDESONIDE-FORMOTEROL FUMARATE 80-4.5 MCG/ACT IN AERO
2.0000 | INHALATION_SPRAY | Freq: Every day | RESPIRATORY_TRACT | Status: DC | PRN
  Filled 2012-10-04: qty 6.9

## 2012-10-04 MED ORDER — DOCUSATE SODIUM 100 MG PO CAPS
100.0000 mg | ORAL_CAPSULE | Freq: Two times a day (BID) | ORAL | Status: DC
  Filled 2012-10-04: qty 1

## 2012-10-04 MED ORDER — LIDOCAINE HCL (CARDIAC) 20 MG/ML IV SOLN
INTRAVENOUS | Status: DC | PRN
  Administered 2012-10-04: 50 mg via INTRAVENOUS

## 2012-10-04 MED ORDER — PANTOPRAZOLE SODIUM 40 MG PO TBEC
40.0000 mg | DELAYED_RELEASE_TABLET | Freq: Two times a day (BID) | ORAL | Status: DC
  Administered 2012-10-04 – 2012-10-05 (×2): 40 mg via ORAL
  Filled 2012-10-04 (×2): qty 1

## 2012-10-04 MED ORDER — LEVOTHYROXINE SODIUM 25 MCG PO TABS
25.0000 ug | ORAL_TABLET | Freq: Every day | ORAL | Status: DC
  Filled 2012-10-04: qty 1

## 2012-10-04 MED ORDER — FENTANYL CITRATE 0.05 MG/ML IJ SOLN
INTRAMUSCULAR | Status: AC
  Filled 2012-10-04: qty 2

## 2012-10-04 MED ORDER — ROCURONIUM BROMIDE 100 MG/10ML IV SOLN
INTRAVENOUS | Status: DC | PRN
  Administered 2012-10-04: 25 mg via INTRAVENOUS

## 2012-10-04 MED ORDER — PROPOFOL 10 MG/ML IV BOLUS
INTRAVENOUS | Status: DC | PRN
  Administered 2012-10-04: 80 mg via INTRAVENOUS

## 2012-10-04 MED ORDER — CARBOXYMETHYLCELLUL-GLYCERIN 0.5-0.9 % OP SOLN: 1.0000 [drp] | Freq: Two times a day (BID) | OPHTHALMIC | Status: DC | PRN

## 2012-10-04 MED ORDER — FAMOTIDINE 20 MG PO TABS
20.0000 mg | ORAL_TABLET | Freq: Every day | ORAL | Status: DC
  Administered 2012-10-04: 20 mg via ORAL
  Filled 2012-10-04 (×2): qty 1

## 2012-10-04 MED ORDER — LACTATED RINGERS IV SOLN
INTRAVENOUS | Status: DC | PRN
  Administered 2012-10-04: 08:00:00 via INTRAVENOUS

## 2012-10-04 MED ORDER — FENTANYL CITRATE 0.05 MG/ML IJ SOLN
INTRAMUSCULAR | Status: DC | PRN
  Administered 2012-10-04: 50 ug via INTRAVENOUS

## 2012-10-04 SURGICAL SUPPLY — 48 items
BLADE SURG 10 STRL SS (BLADE) ×2 IMPLANT
BLADE SURG 15 STRL LF DISP TIS (BLADE) ×1 IMPLANT
BLADE SURG 15 STRL SS (BLADE) ×1
BLADE SURG ROTATE 9660 (MISCELLANEOUS) ×2 IMPLANT
CHLORAPREP W/TINT 26ML (MISCELLANEOUS) ×2 IMPLANT
CLOTH BEACON ORANGE TIMEOUT ST (SAFETY) ×2 IMPLANT
COVER SURGICAL LIGHT HANDLE (MISCELLANEOUS) ×2 IMPLANT
DECANTER SPIKE VIAL GLASS SM (MISCELLANEOUS) IMPLANT
DERMABOND ADVANCED (GAUZE/BANDAGES/DRESSINGS) ×1
DERMABOND ADVANCED .7 DNX12 (GAUZE/BANDAGES/DRESSINGS) ×1 IMPLANT
DRAIN PENROSE 1/2X12 LTX STRL (WOUND CARE) IMPLANT
DRAPE LAPAROSCOPIC ABDOMINAL (DRAPES) ×2 IMPLANT
DRAPE UTILITY 15X26 W/TAPE STR (DRAPE) ×4 IMPLANT
ELECT CAUTERY BLADE 6.4 (BLADE) ×2 IMPLANT
ELECT REM PT RETURN 9FT ADLT (ELECTROSURGICAL) ×2
ELECTRODE REM PT RTRN 9FT ADLT (ELECTROSURGICAL) ×1 IMPLANT
GLOVE BIO SURGEON STRL SZ7 (GLOVE) ×2 IMPLANT
GLOVE BIO SURGEON STRL SZ7.5 (GLOVE) ×2 IMPLANT
GLOVE BIOGEL PI IND STRL 7.0 (GLOVE) ×1 IMPLANT
GLOVE BIOGEL PI INDICATOR 7.0 (GLOVE) ×1
GLOVE SS BIOGEL STRL SZ 6.5 (GLOVE) ×1 IMPLANT
GLOVE SUPERSENSE BIOGEL SZ 6.5 (GLOVE) ×1
GOWN STRL NON-REIN LRG LVL3 (GOWN DISPOSABLE) ×6 IMPLANT
KIT BASIN OR (CUSTOM PROCEDURE TRAY) ×2 IMPLANT
KIT ROOM TURNOVER OR (KITS) ×2 IMPLANT
MESH ULTRAPRO 3X6 7.6X15CM (Mesh General) ×2 IMPLANT
NEEDLE HYPO 25GX1X1/2 BEV (NEEDLE) ×2 IMPLANT
NS IRRIG 1000ML POUR BTL (IV SOLUTION) ×2 IMPLANT
PACK SURGICAL SETUP 50X90 (CUSTOM PROCEDURE TRAY) ×2 IMPLANT
PAD ARMBOARD 7.5X6 YLW CONV (MISCELLANEOUS) ×4 IMPLANT
PENCIL BUTTON HOLSTER BLD 10FT (ELECTRODE) ×2 IMPLANT
SPONGE LAP 18X18 X RAY DECT (DISPOSABLE) ×2 IMPLANT
SUT MNCRL AB 4-0 PS2 18 (SUTURE) ×2 IMPLANT
SUT PROLENE 2 0 SH DA (SUTURE) ×4 IMPLANT
SUT SILK 2 0 SH (SUTURE) ×2 IMPLANT
SUT SILK 3 0 (SUTURE) ×1
SUT SILK 3-0 18XBRD TIE 12 (SUTURE) ×1 IMPLANT
SUT VIC AB 0 CT1 27 (SUTURE) ×1
SUT VIC AB 0 CT1 27XBRD ANBCTR (SUTURE) ×1 IMPLANT
SUT VIC AB 2-0 SH 27 (SUTURE) ×1
SUT VIC AB 2-0 SH 27X BRD (SUTURE) ×1 IMPLANT
SUT VIC AB 3-0 SH 27 (SUTURE) ×1
SUT VIC AB 3-0 SH 27XBRD (SUTURE) ×1 IMPLANT
SYR BULB 3OZ (MISCELLANEOUS) ×2 IMPLANT
SYR CONTROL 10ML LL (SYRINGE) ×2 IMPLANT
TOWEL OR 17X24 6PK STRL BLUE (TOWEL DISPOSABLE) ×2 IMPLANT
TOWEL OR 17X26 10 PK STRL BLUE (TOWEL DISPOSABLE) ×2 IMPLANT
WATER STERILE IRR 1000ML POUR (IV SOLUTION) IMPLANT

## 2012-10-04 NOTE — Anesthesia Postprocedure Evaluation (Signed)
  Anesthesia Post-op Note  Patient: Caitlin Richards  Procedure(s) Performed: Procedure(s): HERNIA REPAIR INGUINAL ADULT (Right) INSERTION OF MESH (Right)  Patient Location: PACU  Anesthesia Type:General  Level of Consciousness: awake and alert   Airway and Oxygen Therapy: Patient Spontanous Breathing and Patient connected to nasal cannula oxygen  Post-op Pain: mild  Post-op Assessment: Post-op Vital signs reviewed, Patient's Cardiovascular Status Stable, Respiratory Function Stable, Patent Airway and No signs of Nausea or vomiting  Post-op Vital Signs: Reviewed and stable  Complications: No apparent anesthesia complications

## 2012-10-04 NOTE — Interval H&P Note (Signed)
History and Physical Interval Note:  10/04/2012 7:16 AM  Caitlin Richards  has presented today for surgery, with the diagnosis of right inguinal hernia  The various methods of treatment have been discussed with the patient and family. After consideration of risks, benefits and other options for treatment, the patient has consented to  Procedure(s): HERNIA REPAIR INGUINAL ADULT (Right) INSERTION OF MESH (Right) as a surgical intervention .  The patient's history has been reviewed, patient examined, no change in status, stable for surgery.  I have reviewed the patient's chart and labs.  Questions were answered to the patient's satisfaction.     TOTH III,PAUL S

## 2012-10-04 NOTE — Transfer of Care (Signed)
Immediate Anesthesia Transfer of Care Note  Patient: Caitlin Richards  Procedure(s) Performed: Procedure(s): HERNIA REPAIR INGUINAL ADULT (Right) INSERTION OF MESH (Right)  Patient Location: PACU  Anesthesia Type:General  Level of Consciousness: awake, alert , oriented and sedated  Airway & Oxygen Therapy: Patient Spontanous Breathing and Patient connected to nasal cannula oxygen  Post-op Assessment: Report given to PACU RN, Post -op Vital signs reviewed and stable and Patient moving all extremities  Post vital signs: Reviewed and stable  Complications: No apparent anesthesia complications

## 2012-10-04 NOTE — Preoperative (Signed)
Beta Blockers   Reason not to administer Beta Blockers:Not Applicable 

## 2012-10-04 NOTE — Op Note (Signed)
10/04/2012  9:18 AM  PATIENT:  Caitlin Richards  79 y.o. female  PRE-OPERATIVE DIAGNOSIS:  right inguinal hernia  POST-OPERATIVE DIAGNOSIS:  right inguinal hernia  PROCEDURE:  Procedure(s): HERNIA REPAIR INGUINAL ADULT (Right) INSERTION OF MESH (Right)  SURGEON:  Surgeon(s) and Role:    * Robyne Askew, MD - Primary  PHYSICIAN ASSISTANT:   ASSISTANTS: none   ANESTHESIA:   general  EBL:  Total I/O In: 750 [I.V.:750] Out: 10 [Blood:10]  BLOOD ADMINISTERED:none  DRAINS: none   LOCAL MEDICATIONS USED:  MARCAINE     SPECIMEN:  No Specimen  DISPOSITION OF SPECIMEN:  N/A  COUNTS:  YES  TOURNIQUET:  * No tourniquets in log *  DICTATION: .Dragon Dictation After informed consent was obtained patient was brought to the operating room and placed in the supine position on the operating table. After adequate induction of general anesthesia the patient's abdomen and right groin were prepped with ChloraPrep, allowed to dry, and draped in usual sterile manner. The right groin area was then infiltrated with quarter percent Marcaine. An incision was made with a 15 blade knife from the edge of the pubic tubercle toward the anterior superior iliac spine. This incision was carried through the skin and subcutaneous tissue sharply with electrocautery until the fascia of the external oblique was encountered. The external oblique fascia was opened along its fibers towards the apex of the external ring with a 15 blade knife and Metzenbaum scissors. A wheatland retractor was deployed. Blunt dissection was then carried out of the hernia sac until it could be surrounded between 2 fingers. The hernia sac was then gently dissected free from the rest of the tissue. The hernia sac was then opened sharply with Metzenbaum scissors and there were no visceral contents within the sac. The sac was ligated at 2-0 silk suture ligature. The distal sac was removed. Next a 3 x 6 piece of ultrapro mesh was chosen and cut  to fit. The mesh was sewn inferiorly to the shelving edge of the inguinal ligament with a running 2-0 Prolene stitch. The mesh was sutured superiorly to the musculoaponeurotic strength layer of the transversalis with 2-0 Prolene vertical mattress stitches. Once this was accomplished the mesh appeared to be in good position in the hernia same well repaired. During the dissection the ilioinguinal nerve was identified and involved with scar tissue. It was dissected free and clamped proximally and distally, divided, and ligated with 2-0 silk ties. Next the wound was irrigated with copious amounts of saline. The external oblique fascia was then reapproximated with a running 2-0 Vicryl stitch. The wound was infiltrated with quarter percent Marcaine. The subcutaneous fascia was closed with a running 3-0 Vicryl stitch. The skin was then closed with a running 4-0 Monocryl subcuticular stitch. Dermabond dressings were applied. The patient tolerated the procedure well. At the end of the case all needle sponge and instrument counts were correct. The patient was then awakened and taken to recovery in stable condition.  PLAN OF CARE: Admit for overnight observation  PATIENT DISPOSITION:  PACU - hemodynamically stable.   Delay start of Pharmacological VTE agent (>24hrs) due to surgical blood loss or risk of bleeding: not applicable

## 2012-10-04 NOTE — Anesthesia Preprocedure Evaluation (Addendum)
Anesthesia Evaluation  Patient identified by MRN, date of birth, ID band  Reviewed: Allergy & Precautions, H&P , NPO status , Patient's Chart, lab work & pertinent test results, reviewed documented beta blocker date and time   Airway Mallampati: II TM Distance: >3 FB Neck ROM: limited  Mouth opening: Limited Mouth Opening  Dental  (+) Teeth Intact and Dental Advidsory Given   Pulmonary shortness of breath and with exertion, asthma , pneumonia -, resolved,  breath sounds clear to auscultation        Cardiovascular hypertension, Pt. on medications + angina with exertion + CAD and +CHF     Neuro/Psych Anxiety  Neuromuscular disease    GI/Hepatic hiatal hernia, GERD-  Medicated,  Endo/Other  Hypothyroidism   Renal/GU      Musculoskeletal   Abdominal   Peds  Hematology   Anesthesia Other Findings   Reproductive/Obstetrics                          Anesthesia Physical Anesthesia Plan  ASA: III  Anesthesia Plan: General ETT   Post-op Pain Management:    Induction:   Airway Management Planned:   Additional Equipment:   Intra-op Plan:   Post-operative Plan:   Informed Consent: I have reviewed the patients History and Physical, chart, labs and discussed the procedure including the risks, benefits and alternatives for the proposed anesthesia with the patient or authorized representative who has indicated his/her understanding and acceptance.   Dental Advisory Given  Plan Discussed with: Anesthesiologist, CRNA and Surgeon  Anesthesia Plan Comments:         Anesthesia Quick Evaluation

## 2012-10-04 NOTE — H&P (Signed)
[name]  08/09/2012 1:50 PM   Office Visit  MRN:  [id number]   Description: 79 year old female  Provider: Robyne Askew, MD  Department: [facility]        Diagnoses    Right inguinal hernia    -  Primary    550.90      Reason for Visit    New Evaluation    eval RIH        Current Vitals - Last Recorded    BP Pulse Temp(Src) Resp Ht Wt    128/70 60 97.3 F (36.3 C) (Temporal) 20 5\' 2"  (1.575 m) 126 lb 3.2 oz (57.244 kg)       BMI              23.08 kg/m2                 Progress Notes    Robyne Askew, MD at 08/09/2012  2:30 PM    Status: Signed                   Subjective:       Patient ID: Caitlin Richards, female   DOB: 1917/01/04, 79 y.o.   MRN: [id number]   HPI We're asked to see the patient in consultation by Dr. Elnoria Howard to evaluate her for a right inguinal hernia. The patient is a 79 year old white female who had a barium swallow about 2 years ago. It caused her such bad constipation that she strained very hard to have a bowel movement and developed a bulge in her right groin. Since then she has had some intermittent pain and constipation associated with it. She denies any nausea or vomiting.   Review of Systems  Constitutional: Negative.   HENT: Negative.   Eyes: Negative.   Respiratory: Negative.   Cardiovascular: Negative.   Gastrointestinal: Negative.   Endocrine: Negative.   Genitourinary: Negative.   Musculoskeletal: Negative.   Skin: Negative.   Allergic/Immunologic: Negative.   Neurological: Negative.   Hematological: Negative.   Psychiatric/Behavioral: Negative.           Objective:     Physical Exam  Constitutional: She is oriented to person, place, and time. She appears well-developed and well-nourished.  HENT:   Head: Normocephalic and atraumatic.  Eyes: Conjunctivae and EOM are normal. Pupils are equal, round, and reactive to light.  Neck: Normal range of motion. Neck supple.  Cardiovascular: Normal rate,  regular rhythm and normal heart sounds.   Pulmonary/Chest: Effort normal and breath sounds normal.  Abdominal: Soft. Bowel sounds are normal.  Genitourinary:  There is a moderate-sized bulge in the right groin that reduces easily  Musculoskeletal: Normal range of motion.  Neurological: She is alert and oriented to person, place, and time.  Skin: Skin is warm and dry.  Psychiatric: She has a normal mood and affect. Her behavior is normal.          Assessment:       The patient appears to have a moderate-sized symptomatic right inguinal hernia. Because of the risk of incarceration and strangulation I think she would benefit from having a hernia fixed. She would also like to have this done. I have discussed with her in detail the risks and benefits of the operation to fix the hernia as well as some of the technical aspects including the use of mesh and the risk of infection and she understands and wishes to proceed. She does have some  cardiac history and sees Dr. Anne Fu in cardiology.     Plan:       Plan for right inguinal hernia repair with mesh once we have cardiac clearance

## 2012-10-05 ENCOUNTER — Encounter (HOSPITAL_COMMUNITY): Payer: Self-pay | Admitting: Surgery

## 2012-10-05 LAB — CBC
MCH: 31.6 pg (ref 26.0–34.0)
MCV: 92.8 fL (ref 78.0–100.0)
Platelets: 157 10*3/uL (ref 150–400)
RDW: 14.4 % (ref 11.5–15.5)

## 2012-10-05 LAB — BASIC METABOLIC PANEL
Calcium: 9.2 mg/dL (ref 8.4–10.5)
Creatinine, Ser: 0.62 mg/dL (ref 0.50–1.10)
GFR calc Af Amer: 87 mL/min — ABNORMAL LOW (ref >90)

## 2012-10-05 MED ORDER — TRAMADOL HCL 50 MG PO TABS: 50.0000 mg | ORAL_TABLET | Freq: Four times a day (QID) | ORAL | Status: DC | PRN

## 2012-10-05 NOTE — Discharge Summary (Signed)
Physician Discharge Summary  Patient ID: Caitlin Richards MRN: [id number] DOB/AGE: 1916-09-18 79 y.o.  Admit date: 10/04/2012 Discharge date: 10/05/2012  Admission Diagnoses:  Discharge Diagnoses:  Active Problems:   * No active hospital problems. *   Discharged Condition: good  Hospital Course: the pt underwent right inguinal hernia repair with mesh. She was kept overnight because of her age and mental status. She seems to be back to her baseline today and will plan for discharge  Consults: None  Significant Diagnostic Studies: none  Treatments: surgery: as above  Discharge Exam: Blood pressure 145/60, pulse 76, temperature 98 F (36.7 C), temperature source Oral, resp. rate 16, SpO2 90.00%. GI: incision looks good  Disposition: 01-Home or Self Care  Discharge Orders   Future Appointments Provider Department Dept Phone   10/26/2012 9:40 AM Robyne Askew, MD [facility], [place] [phone]   Future Orders Complete By Expires     Call MD for:  difficulty breathing, headache or visual disturbances  As directed     Call MD for:  extreme fatigue  As directed     Call MD for:  hives  As directed     Call MD for:  persistant dizziness or light-headedness  As directed     Call MD for:  persistant nausea and vomiting  As directed     Call MD for:  redness, tenderness, or signs of infection (pain, swelling, redness, odor or green/yellow discharge around incision site)  As directed     Call MD for:  severe uncontrolled pain  As directed     Call MD for:  temperature >100.4  As directed     Diet - low sodium heart healthy  As directed     Discharge instructions  As directed     Comments:      May shower. No heavy lifting    Increase activity slowly  As directed     No wound care  As directed         Medication List         ALPRAZolam 0.25 MG tablet  Commonly known as:  XANAX  Take 0.25 mg by mouth at bedtime as needed for anxiety.     bisacodyl 5 MG EC tablet   Commonly known as:  DULCOLAX  Take 10 mg by mouth every evening.     budesonide-formoterol 80-4.5 MCG/ACT inhaler  Commonly known as:  SYMBICORT  Inhale 2 puffs into the lungs daily as needed (shortness of breath).     camphor-menthol lotion  Commonly known as:  SARNA  Apply 1 application topically daily as needed for itching. Uses after showering     carbidopa-levodopa 10-100 MG per tablet  Commonly known as:  SINEMET IR  Take 2 tablets by mouth 3 (three) times daily.     CENTRUM SILVER PO  Take 1 tablet by mouth daily.     cephALEXin 250 MG capsule  Commonly known as:  KEFLEX  Take 250 mg by mouth every morning. Chronic use to prevent bladder infections     docusate sodium 100 MG capsule  Commonly known as:  COLACE  Take 100 mg by mouth 2 (two) times daily. Morning and lunch     ELIQUIS 2.5 MG Tabs tablet  Generic drug:  apixaban  Take 2.5 mg by mouth 2 (two) times daily.     famotidine 20 MG tablet  Commonly known as:  PEPCID  Take 20 mg by mouth at bedtime.  fexofenadine 60 MG tablet  Commonly known as:  ALLEGRA  Take 60 mg by mouth daily.     hydrochlorothiazide 12.5 MG capsule  Commonly known as:  MICROZIDE  Take 12.5 mg by mouth daily.     levothyroxine 25 MCG tablet  Commonly known as:  SYNTHROID, LEVOTHROID  Take 25 mcg by mouth daily before lunch.     mometasone 50 MCG/ACT nasal spray  Commonly known as:  NASONEX  Place 2 sprays into the nose at bedtime.     omeprazole 20 MG capsule  Commonly known as:  PRILOSEC  Take 20 mg by mouth 2 (two) times daily. Take in AM and @ 1600     OPTIVE 0.5-0.9 % Soln  Generic drug:  Carboxymethylcellul-Glycerin  Apply 1 drop to eye 2 (two) times daily as needed (dry eyes).     OYSTER SHELL CALCIUM 500 + D PO  Take 1 tablet by mouth 2 (two) times daily.     traMADol 50 MG tablet  Commonly known as:  ULTRAM  Take 1-2 tablets (50-100 mg total) by mouth every 6 (six) hours as needed for pain.            Follow-up Information   Follow up with TOTH III,Shilo Philipson S, MD In 3 weeks.   Contact information:   [address] [place] [address] [phone]       Signed: Robyne Askew 10/05/2012, 7:36 AM

## 2012-10-05 NOTE — Progress Notes (Signed)
1 Day Post-Op  Subjective: Complains of chronic back pain  Objective: Vital signs in last 24 hours: Temp:  [97.7 F (36.5 C)-98.2 F (36.8 C)] 98 F (36.7 C) (07/08 0556) Pulse Rate:  [44-77] 76 (07/08 0556) Resp:  [14-25] 16 (07/08 0556) BP: (139-165)/(47-118) 145/60 mmHg (07/08 0556) SpO2:  [86 %-100 %] 90 % (07/08 0556) Last BM Date: 10/03/12  Intake/Output from previous day: 07/07 0701 - 07/08 0700 In: 1704 [I.V.:1704] Out: 860 [Urine:850; Blood:10] Intake/Output this shift:    GI: soft, nontender. right groin incision looks good  Lab Results:  No results found for this basename: WBC, HGB, HCT, PLT,  in the last 72 hours BMET No results found for this basename: NA, K, CL, CO2, GLUCOSE, BUN, CREATININE, CALCIUM,  in the last 72 hours PT/INR No results found for this basename: LABPROT, INR,  in the last 72 hours ABG No results found for this basename: PHART, PCO2, PO2, HCO3,  in the last 72 hours  Studies/Results: No results found.  Anti-infectives: Anti-infectives   Start     Dose/Rate Route Frequency Ordered Stop   10/04/12 0600  ceFAZolin (ANCEF) IVPB 2 g/50 mL premix     2 g 100 mL/hr over 30 Minutes Intravenous On call to O.R. 10/03/12 1352 10/04/12 0811      Assessment/Plan: s/p Procedure(s): HERNIA REPAIR INGUINAL ADULT (Right) INSERTION OF MESH (Right) Advance diet Discharge  LOS: 1 day    TOTH III,Jamill Wetmore S 10/05/2012

## 2012-10-26 ENCOUNTER — Ambulatory Visit (INDEPENDENT_AMBULATORY_CARE_PROVIDER_SITE_OTHER): Payer: Medicare Other | Admitting: General Surgery

## 2012-10-26 ENCOUNTER — Encounter (INDEPENDENT_AMBULATORY_CARE_PROVIDER_SITE_OTHER): Payer: Self-pay | Admitting: General Surgery

## 2012-10-26 VITALS — BP 140/82 | HR 76 | Resp 16 | Ht 63.0 in | Wt 118.4 lb

## 2012-10-26 DIAGNOSIS — K4090 Unilateral inguinal hernia, without obstruction or gangrene, not specified as recurrent: Secondary | ICD-10-CM

## 2012-10-26 NOTE — Progress Notes (Signed)
Subjective:     Patient ID: Caitlin Richards, female   DOB: [birth year], 79 y.o.   MRN: [id number]  HPI The patient is a 79 year old white female who is 2 weeks status post right inguinal hernia repair with mesh. She tolerated the surgery well. She is only required one pain pill. Her appetite is improving and her bowels are moving regularly. She denies any abdominal pain.  Review of Systems     Objective:   Physical Exam On exam her abdomen is soft and nontender. Her right groin incision is healing nicely with no sign of infection or seroma. There is no palpable evidence of recurrence of the hernia. Her abdominal wall feels solid.    Assessment:     The patient is 2 weeks status post right inguinal hernia repair with mesh     Plan:     At this point I would like her to continue to not lift anything heavy for another 4 weeks. We will plan to see her back at the end of that time to check her progress

## 2012-10-26 NOTE — Patient Instructions (Signed)
No heavy lifting 

## 2012-10-28 ENCOUNTER — Telehealth: Payer: Self-pay | Admitting: Neurology

## 2012-10-28 MED ORDER — CARBIDOPA-LEVODOPA 10-100 MG PO TABS: 2.0000 | ORAL_TABLET | Freq: Three times a day (TID) | ORAL | Status: DC

## 2012-10-28 NOTE — Telephone Encounter (Signed)
Rx sent.  I called back, got no answer, no VM.

## 2012-10-29 ENCOUNTER — Telehealth: Payer: Self-pay | Admitting: Nurse Practitioner

## 2012-11-02 NOTE — Telephone Encounter (Signed)
FYI:  I spoke to daughter who wanted me to give a "heads up" to [place] before the patient's appointment on 11-17-12.  The patient has been experiencing dizziness and had a fall in the hospital after her hernia surgery.  The daughter has brought mom to dermatologist for a rash and PCP, they have told her the dizziness is most likely caused by her medications.  She will discuss these issues when she comes with pt for appointment.

## 2012-11-03 NOTE — Telephone Encounter (Signed)
Phone message noted. Patient has not been seen since June 2013.

## 2012-11-17 ENCOUNTER — Ambulatory Visit (INDEPENDENT_AMBULATORY_CARE_PROVIDER_SITE_OTHER): Payer: Medicare Other | Admitting: Nurse Practitioner

## 2012-11-17 ENCOUNTER — Encounter: Payer: Self-pay | Admitting: Nurse Practitioner

## 2012-11-17 VITALS — BP 163/75 | HR 68 | Wt 121.0 lb

## 2012-11-17 DIAGNOSIS — M4800 Spinal stenosis, site unspecified: Secondary | ICD-10-CM

## 2012-11-17 DIAGNOSIS — R269 Unspecified abnormalities of gait and mobility: Secondary | ICD-10-CM

## 2012-11-17 DIAGNOSIS — G609 Hereditary and idiopathic neuropathy, unspecified: Secondary | ICD-10-CM | POA: Insufficient documentation

## 2012-11-17 MED ORDER — CARBIDOPA-LEVODOPA 10-100 MG PO TABS: 2.0000 | ORAL_TABLET | Freq: Three times a day (TID) | ORAL | Status: DC

## 2012-11-17 NOTE — Patient Instructions (Addendum)
Continue carbidopa levodopa at these times, 6 AM, 3 PM, 9 PM Please call after consistently using this regimen for 2 weeks if symptoms have not improved Use  walker at all times Followup 6 months to one year or as needed

## 2012-11-17 NOTE — Progress Notes (Signed)
Reason for visit follow up mild axonal peripheral neuropathy.  HPI: Ms Mcelrath,  is a pleasant 79 year old right-handed Caucasian female, with her daughter at today's clinical visit.Last seen 09/16/11.   Laboratory evaluation has demonstrate normal protein electrophoresis, vitamin D level, ESR, C-reactive protein. We also increased her Sinemet to 10/100, 2 tabs TID  overall her bilateral lower extremity paresthesia has much improved, worse some times  In the middle of the night she has constipation, chronic low back pain, previous laminectomy. She and her daughter,  overall happy about current progress, we'll hold off further evaluation, such as EMG nerve conduction at this point  She developed bilateral lower extremity paresthesia about three  years ago, describe plantar feet, and toes numbness tingling, worst time is at nighttime, when she is trying to relax, she has the urge to rubbing her feet against her legs, jerking movement, difficulty falling to sleep. She was given Sinemet 10/100 t.i.d. by her neurologist at [facility] for hands tremor.   11/10/12 Pt returns for followup because she needs new RX. Sinemet continues to be very beneficial, she denies side effects. She has not fallen, denies confusion or hallucinations. Using rolling walker, living in an assisted living facility.    History: She has past medical history of hypertension, congestive heart failure, coronary artery disease, history of lumbar spinal stenosis, presenting with a year history of bilateral feet numbness tingling.  She had a history of L4, L5 laminectomy and microdiscectomy in 2009 at [facility] for lower back pain, which has been very helpful, she moved to [place] to be with her daughter about 3 years ago, for a while, she hadl frequent illness, including pneumonia, dysphagia, which improved by Botox injection.   ROS:  Negative except for blurred vision, swelling in legs, memory loss, headache, dizziness, joint  pain   Medications Current Outpatient Prescriptions on File Prior to Visit  Medication Sig Dispense Refill  . ALPRAZolam (XANAX) 0.25 MG tablet Take 0.25 mg by mouth at bedtime as needed for anxiety.      Marland Kitchen apixaban (ELIQUIS) 2.5 MG TABS tablet Take 2.5 mg by mouth 2 (two) times daily.      . bisacodyl (DULCOLAX) 5 MG EC tablet Take 10 mg by mouth every evening.       . budesonide-formoterol (SYMBICORT) 80-4.5 MCG/ACT inhaler Inhale 2 puffs into the lungs daily as needed (shortness of breath).       . Calcium Carbonate-Vitamin D (OYSTER SHELL CALCIUM 500 + D PO) Take 1 tablet by mouth 2 (two) times daily.      . Carboxymethylcellul-Glycerin (OPTIVE) 0.5-0.9 % SOLN Apply 1 drop to eye 2 (two) times daily as needed (dry eyes).       . clotrimazole-betamethasone (LOTRISONE) cream       . docusate sodium (COLACE) 100 MG capsule Take 100 mg by mouth 2 (two) times daily. Morning and lunch      . famotidine (PEPCID) 20 MG tablet Take 20 mg by mouth at bedtime.        . fexofenadine (ALLEGRA) 60 MG tablet Take 60 mg by mouth daily.       . hydrochlorothiazide (,MICROZIDE/HYDRODIURIL,) 12.5 MG capsule Take 12.5 mg by mouth daily.       . mometasone (NASONEX) 50 MCG/ACT nasal spray Place 2 sprays into the nose at bedtime.       . Multiple Vitamins-Minerals (CENTRUM SILVER PO) Take 1 tablet by mouth daily.       Marland Kitchen omeprazole (PRILOSEC) 20 MG capsule Take  20 mg by mouth 2 (two) times daily. Take in AM and @ 1600      . triamcinolone cream (KENALOG) 0.5 %       . BYSTOLIC 2.5 MG tablet       . camphor-menthol (SARNA) lotion Apply 1 application topically daily as needed for itching. Uses after showering      . tolterodine (DETROL LA) 4 MG 24 hr capsule        No current facility-administered medications on file prior to visit.    Allergies  Allergies  Allergen Reactions  . Shrimp [Shellfish Allergy] Itching  . Amoxicillin Other (See Comments)    Unknown  . Clopidogrel Bisulfate     REACTION:  shakes  . Hydralazine     Unknown  . Lisinopril     REACTION: respiratory issues  . Macrobid [Nitrofurantoin Macrocrystal] Other (See Comments)    Unknown  . Oxycodone-Acetaminophen     REACTION: anaphylaxis  . Sulfonamide Derivatives     REACTION: unknown  . Tape Other (See Comments)    Extended exposure can lead to rash  . Trimethoprim Other (See Comments)    Unknown  . Percocet [Oxycodone-Acetaminophen]   . Plavix [Clopidogrel Bisulfate]     Physical Exam General: well developed, well nourished, seated, in no evident distress  Neurologic Exam Mental Status: Awake and fully alert. Oriented to place and time. Follows all commands. Speech and language normal.   Cranial Nerves:  Pupils equal, briskly reactive to light. Extraocular movements full without nystagmus. Visual fields full to confrontation. Hearing intact and symmetric to finger snap. Facial sensation intact. Face, tongue, palate move normally and symmetrically. Neck flexion and extension normal.  Motor: Normal bulk and tone. Normal strength in all tested extremity muscles except mild bilateral foot drop Sensory.: Length dependent decreased light touch, pinprick to mid calf level and decreased vibratory to toes. Preserved proprioception.  Coordination: Rapid alternating movements normal in all extremities. Finger-to-nose and heel-to-shin performed accurately bilaterally. No dysmetria Gait and Station: Arises from chair pushing off  mild unsteady gait with walker, tandem gait on attempted.  Reflexes: 1+ and symmetric except absent Achilles. Toes downgoing.     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

## 2012-11-18 ENCOUNTER — Encounter: Payer: Self-pay | Admitting: Nurse Practitioner

## 2012-11-18 ENCOUNTER — Other Ambulatory Visit: Payer: Self-pay | Admitting: Neurology

## 2012-11-24 ENCOUNTER — Encounter (INDEPENDENT_AMBULATORY_CARE_PROVIDER_SITE_OTHER): Payer: Medicare Other | Admitting: General Surgery

## 2012-11-25 ENCOUNTER — Encounter (INDEPENDENT_AMBULATORY_CARE_PROVIDER_SITE_OTHER): Payer: Medicare Other | Admitting: General Surgery

## 2012-12-07 ENCOUNTER — Encounter (INDEPENDENT_AMBULATORY_CARE_PROVIDER_SITE_OTHER): Payer: Self-pay | Admitting: General Surgery

## 2012-12-07 ENCOUNTER — Ambulatory Visit (INDEPENDENT_AMBULATORY_CARE_PROVIDER_SITE_OTHER): Payer: Medicare Other | Admitting: General Surgery

## 2012-12-07 VITALS — BP 130/72 | HR 70 | Resp 16 | Ht 62.0 in | Wt 123.2 lb

## 2012-12-07 DIAGNOSIS — K4090 Unilateral inguinal hernia, without obstruction or gangrene, not specified as recurrent: Secondary | ICD-10-CM

## 2012-12-07 NOTE — Patient Instructions (Signed)
May return to all normal activities 

## 2012-12-13 NOTE — Progress Notes (Signed)
Subjective:     Patient ID: Caitlin Richards, female   DOB: 02-Feb-1917, 79 y.o.   MRN: [id number]  HPI The patient is a 79 year old white female who is about 6 weeks status post right inguinal hernia repair with mesh. She has done well and has no complaints today. Her appetite is good no rales are working normally. She denies any groin pain.  Review of Systems     Objective:   Physical Exam On exam her right groin incision is healing nicely with no sign of infection or significant seroma. There is no palpable evidence for recurrence of the hernia.    Assessment:     The patient is 6 weeks status post right inguinal hernia repair with mesh     Plan:     At this point she can return to her normal activities without any restrictions. We will plan to see her back on a when necessary basis

## 2013-01-24 ENCOUNTER — Telehealth: Payer: Self-pay | Admitting: Nurse Practitioner

## 2013-01-25 ENCOUNTER — Ambulatory Visit: Payer: Medicare Other | Admitting: Nurse Practitioner

## 2013-01-26 NOTE — Telephone Encounter (Signed)
Patient has an appointment with Dr. Terrace Arabia on 01-27-13.

## 2013-01-27 ENCOUNTER — Encounter: Payer: Self-pay | Admitting: Neurology

## 2013-01-27 ENCOUNTER — Ambulatory Visit (INDEPENDENT_AMBULATORY_CARE_PROVIDER_SITE_OTHER): Payer: Medicare Other | Admitting: Neurology

## 2013-01-27 VITALS — BP 142/67 | HR 67 | Ht 59.5 in | Wt 121.0 lb

## 2013-01-27 DIAGNOSIS — I10 Essential (primary) hypertension: Secondary | ICD-10-CM

## 2013-01-27 DIAGNOSIS — I509 Heart failure, unspecified: Secondary | ICD-10-CM

## 2013-01-27 DIAGNOSIS — L03115 Cellulitis of right lower limb: Secondary | ICD-10-CM

## 2013-01-27 DIAGNOSIS — R269 Unspecified abnormalities of gait and mobility: Secondary | ICD-10-CM

## 2013-01-27 DIAGNOSIS — L02419 Cutaneous abscess of limb, unspecified: Secondary | ICD-10-CM

## 2013-01-27 DIAGNOSIS — G250 Essential tremor: Secondary | ICD-10-CM

## 2013-01-27 MED ORDER — CARBIDOPA-LEVODOPA 10-100 MG PO TABS: 2.0000 | ORAL_TABLET | Freq: Three times a day (TID) | ORAL | Status: AC

## 2013-01-27 NOTE — Progress Notes (Signed)
HPI:  Mrs Bula,  is a pleasant 79 year old right-handed Caucasian female, with her daughter at today's clinical visit.   History: She has past medical history of hypertension, congestive heart failure, coronary artery disease, history of lumbar spinal stenosis, presenting with a year history of bilateral feet numbness tingling.  She had a history of L4, L5 laminectomy and microdiscectomy in 2009 at [place] for lower back pain, which has been very helpful, she moved to [place] to be with her daughter about 3 years ago, for a while, she had frequent illness, including pneumonia, dysphagia, which improved by Botox injection.  She developed bilateral lower extremity paresthesia in 2010,  she describes plantar feet, and toes numbness tingling, worst time is at nighttime, when she is trying to relax, she has the urge to rub her feet against her legs, jerking movement, difficulty falling to sleep.  She was given Sinemet 10/100 t.i.d. by her neurologist at [place] for hands tremor which has been very helpful.  Laboratory evaluation has demonstrate normal protein electrophoresis, vitamin D level, ESR, C-reactive protein. We also increased her Sinemet to 10/100, 2 tabs TID  overall her bilateral lower extremity paresthesia has much improved, worse some times  In the middle of the night she has constipation, chronic low back pain, previous laminectomy. She and her daughter,  overall happy about current progress, we'll hold off further evaluation, such as EMG nerve conduction at this point   UPDATE 01/27/2013:  She lives in an assistant living at [place], 12 miles from her daughter's home,  She prepare her breakfast, does laundary, she talks with her son, and her friends on the phone. She goes to church once a week.  She walks up and down the hallways.  She reads some local newspaper. Some of her close friend has passed away.  She has anxiety attacks sometimes, she goes to bed at 10pm, wakeup around 1am, she  recall things that she used to go, doze off at 4am again, she fixed up breakfast.  She is looking forward to the visit of her son  She is taking Sinemet 10/100 ii tid. In recent two days, she also developed right foot swelling, blister at top of her right foot.   ROS:  Negative except for blurred vision, swelling in legs, memory loss, headache, dizziness, joint pain   Medications Current Outpatient Prescriptions on File Prior to Visit  Medication Sig Dispense Refill  . ALPRAZolam (XANAX) 0.25 MG tablet Take 0.25 mg by mouth at bedtime as needed for anxiety.      Marland Kitchen apixaban (ELIQUIS) 2.5 MG TABS tablet Take 2.5 mg by mouth 2 (two) times daily.      . bisacodyl (DULCOLAX) 5 MG EC tablet Take 10 mg by mouth every evening.       . budesonide-formoterol (SYMBICORT) 80-4.5 MCG/ACT inhaler Inhale 2 puffs into the lungs daily as needed (shortness of breath).       . Calcium Carbonate-Vitamin D (OYSTER SHELL CALCIUM 500 + D PO) Take 1 tablet by mouth 2 (two) times daily.      . carbidopa-levodopa (SINEMET IR) 10-100 MG per tablet Take 2 tablets by mouth 3 (three) times daily. 1st tab at 6am, next at 3pm and next at 9pm  180 tablet  6  . Carboxymethylcellul-Glycerin (OPTIVE) 0.5-0.9 % SOLN Apply 1 drop to eye 2 (two) times daily as needed (dry eyes).       . clotrimazole-betamethasone (LOTRISONE) cream       . docusate sodium (COLACE) 100  MG capsule Take 100 mg by mouth 2 (two) times daily. Morning and lunch      . famotidine (PEPCID) 20 MG tablet Take 20 mg by mouth at bedtime.        . fexofenadine (ALLEGRA) 60 MG tablet Take 60 mg by mouth daily.       . hydrochlorothiazide (,MICROZIDE/HYDRODIURIL,) 12.5 MG capsule Take 12.5 mg by mouth daily.       . mometasone (NASONEX) 50 MCG/ACT nasal spray Place 2 sprays into the nose at bedtime.       . Multiple Vitamins-Minerals (CENTRUM SILVER PO) Take 1 tablet by mouth daily.       Marland Kitchen omeprazole (PRILOSEC) 20 MG capsule Take 20 mg by mouth 2 (two) times  daily. Take in AM and @ 1600      . SYNTHROID 25 MCG tablet        No current facility-administered medications on file prior to visit.    Allergies  Allergies  Allergen Reactions  . Shrimp [Shellfish Allergy] Itching  . Amoxicillin Other (See Comments)    Unknown  . Clopidogrel Bisulfate     REACTION: shakes  . Hydralazine     Unknown  . Lisinopril     REACTION: respiratory issues  . Macrobid [Nitrofurantoin Macrocrystal] Other (See Comments)    Unknown  . Oxycodone-Acetaminophen     REACTION: anaphylaxis  . Sulfonamide Derivatives     REACTION: unknown  . Tape Other (See Comments)    Extended exposure can lead to rash  . Trimethoprim Other (See Comments)    Unknown  . Percocet [Oxycodone-Acetaminophen]   . Plavix [Clopidogrel Bisulfate]     Physical Exam General: well developed, well nourished, seated, in no evident distress  Neurologic Exam Mental Status: Awake and fully alert. Oriented to place and time. Follows all commands. Speech and language normal.   Cranial Nerves:  Pupils equal, briskly reactive to light. Extraocular movements full without nystagmus. Visual fields full to confrontation. Hearing intact and symmetric to finger snap. Facial sensation intact. Face, tongue, palate move normally and symmetrically. Neck flexion and extension normal.  Motor: Normal bulk and tone. Normal strength in all tested extremity muscles except mild bilateral foot drop, she has a closed purplish raised rash at top of her right foot 1x1cm, tender, with right foot swelling, extending to her right ankle, distal leg. Sensory.: Length dependent decreased light touch, pinprick to mid calf level and decreased vibratory to toes. Preserved proprioception.  Coordination: Rapid alternating movements normal in all extremities. Finger-to-nose and heel-to-shin performed accurately bilaterally. No dysmetria Gait and Station: Arises from chair pushing on chair arm, dragging her right leg, cautious,  unsteady gait. Reflexes: 1+ and symmetric except absent Achilles. Toes downgoing.  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   1. she has develop right lower extremity cellulitis, will see her primary care physician today. 2  she has symptoms of restless leg syndrome, continue to be helped by Sinemet 10/100 2 tablets 3 times a day, will refill her medications, 3. return to clinic in 6 months.

## 2013-02-03 ENCOUNTER — Other Ambulatory Visit: Payer: Self-pay

## 2013-02-17 ENCOUNTER — Ambulatory Visit: Payer: Self-pay | Admitting: Neurology

## 2013-06-22 ENCOUNTER — Other Ambulatory Visit: Payer: Self-pay | Admitting: Orthopedic Surgery

## 2013-06-22 DIAGNOSIS — M545 Low back pain, unspecified: Secondary | ICD-10-CM

## 2013-07-16 ENCOUNTER — Other Ambulatory Visit: Payer: Self-pay | Admitting: Neurology

## 2013-07-28 ENCOUNTER — Encounter: Payer: Self-pay | Admitting: Nurse Practitioner

## 2013-07-28 ENCOUNTER — Ambulatory Visit (INDEPENDENT_AMBULATORY_CARE_PROVIDER_SITE_OTHER): Payer: Medicare Other | Admitting: Nurse Practitioner

## 2013-07-28 VITALS — BP 202/88 | HR 67 | Ht 59.49 in | Wt 114.0 lb

## 2013-07-28 DIAGNOSIS — R269 Unspecified abnormalities of gait and mobility: Secondary | ICD-10-CM

## 2013-07-28 DIAGNOSIS — G250 Essential tremor: Secondary | ICD-10-CM

## 2013-07-28 DIAGNOSIS — G252 Other specified forms of tremor: Principal | ICD-10-CM

## 2013-07-28 DIAGNOSIS — G609 Hereditary and idiopathic neuropathy, unspecified: Secondary | ICD-10-CM

## 2013-07-28 NOTE — Progress Notes (Signed)
[facility]  PATIENT: Caitlin Richards DOB: Jan 03, 1917   REASON FOR VISIT: follow up for tremors, gait abnormality    HISTORY OF PRESENT ILLNESS: Caitlin Richards, 80 year old female returns for followup. She was last seen in this office 01/27/2013 by Dr. Krista Richards. She has a history of bilateral feet numbness and tingling which is improved with Sinemet low dose. She also has some restless legs sensations which she claims the Sinemet helps. She was due a 2:00 dose and is now 3:00 and she has not taken it. She is complaining that her muscles are tightening. She lives in an assisted living retirement home, walks the halls during the day. She  returns for reevaluation with her daughter.   HISTORY: She has past medical history of hypertension, congestive heart failure, coronary artery disease, history of lumbar spinal stenosis, presenting with a year history of bilateral feet numbness tingling.  She had a history of L4, L5 laminectomy and microdiscectomy in 2009 at [place] for lower back pain, which has been very helpful, she moved to [place] to be with her daughter about 3 years ago, for a while, she had frequent illness, including pneumonia, dysphagia, which improved by Botox injection.  She developed bilateral lower extremity paresthesia in 2010, she describes plantar feet, and toes numbness tingling, worst time is at nighttime, when she is trying to relax, she has the urge to rub her feet against her legs, jerking movement, difficulty falling to sleep. She was given Sinemet 10/100 t.i.d. by her neurologist at [place] for hands tremor which has been very helpful.  Laboratory evaluation has demonstrate normal protein electrophoresis, vitamin D level, ESR, C-reactive protein. We also increased her Sinemet to 10/100, 2 tabs TID overall her bilateral lower extremity paresthesia has much improved, worse some times In the middle of the night she has constipation, chronic low back pain, previous  laminectomy. She and her daughter, overall happy about current progress, we'll hold off further evaluation, such as EMG nerve conduction at this point  UPDATE 01/27/2013:  She lives in an assistant living at [place], 12 miles from her daughter's home, She prepare her breakfast, does laundary, she talks with her son, and her friends on the phone. She goes to church once a week. She walks up and down the hallways. She reads some local newspaper. Some of her close friend has passed away. She has anxiety attacks sometimes, she goes to bed at 10pm, wakeup around 1am, she recall things that she used to go, doze off at 4am again, she fixed up breakfast. She is looking forward to the visit of her son  She is taking Sinemet 10/100 ii tid. In recent two days, she also developed right foot swelling, blister at top of her right foot.      REVIEW OF SYSTEMS: Full 14 system review of systems performed and notable only for those listed, all others are neg:  Constitutional: N/A  Cardiovascular: N/A  Ear/Nose/Throat: N/A  Skin: N/A  Eyes: N/A  Respiratory: N/A  Gastroitestinal: N/A  Hematology/Lymphatic: N/A  Endocrine: N/A Musculoskeletal: Back pain, walking difficulty Allergy/Immunology: N/A  Neurological: Tremors  Psychiatric: N/A Sleep restless legs   ALLERGIES: Allergies  Allergen Reactions  . Shrimp [Shellfish Allergy] Itching  . Amoxicillin Other (See Comments)    Unknown  . Clopidogrel Bisulfate     REACTION: shakes  . Hydralazine     Unknown  . Lisinopril     REACTION: respiratory issues  . Macrobid [Nitrofurantoin Macrocrystal] Other (See Comments)  Unknown  . Oxycodone-Acetaminophen     REACTION: anaphylaxis  . Sulfonamide Derivatives     REACTION: unknown  . Tape Other (See Comments)    Extended exposure can lead to rash  . Trimethoprim Other (See Comments)    Unknown  . Percocet [Oxycodone-Acetaminophen]   . Plavix [Clopidogrel Bisulfate]     HOME  MEDICATIONS: Outpatient Prescriptions Prior to Visit  Medication Sig Dispense Refill  . apixaban (ELIQUIS) 2.5 MG TABS tablet Take 2.5 mg by mouth 2 (two) times daily.      . bisacodyl (DULCOLAX) 5 MG EC tablet Take 10 mg by mouth every evening.       . budesonide-formoterol (SYMBICORT) 80-4.5 MCG/ACT inhaler Inhale 2 puffs into the lungs daily as needed (shortness of breath).       . carbidopa-levodopa (SINEMET IR) 10-100 MG per tablet Take 2 tablets by mouth 3 (three) times daily. 1st tab at 6am, next at 3pm and next at 9pm  180 tablet  12  . carbidopa-levodopa (SINEMET IR) 10-100 MG per tablet TAKE 2 TABLETS BY MOUTH 3 (THREE) TIMES DAILY.  540 tablet  1  . Carboxymethylcellul-Glycerin (OPTIVE) 0.5-0.9 % SOLN Apply 1 drop to eye 2 (two) times daily as needed (dry eyes).       . clotrimazole-betamethasone (LOTRISONE) cream       . famotidine (PEPCID) 20 MG tablet Take 20 mg by mouth at bedtime.        . fexofenadine (ALLEGRA) 60 MG tablet Take 60 mg by mouth daily.       . hydrochlorothiazide (,MICROZIDE/HYDRODIURIL,) 12.5 MG capsule Take 12.5 mg by mouth daily.       . mometasone (NASONEX) 50 MCG/ACT nasal spray Place 2 sprays into the nose at bedtime.       Marland Kitchen omeprazole (PRILOSEC) 20 MG capsule Take 20 mg by mouth 2 (two) times daily. Take in AM and @ 1600      . SYNTHROID 25 MCG tablet       . ALPRAZolam (XANAX) 0.25 MG tablet Take 0.25 mg by mouth at bedtime as needed for anxiety.      . Calcium Carbonate-Vitamin D (OYSTER SHELL CALCIUM 500 + D PO) Take 1 tablet by mouth 2 (two) times daily.      Marland Kitchen docusate sodium (COLACE) 100 MG capsule Take 100 mg by mouth 2 (two) times daily. Morning and lunch      . Multiple Vitamins-Minerals (CENTRUM SILVER PO) Take 1 tablet by mouth daily.        No facility-administered medications prior to visit.    PAST MEDICAL HISTORY: Past Medical History  Diagnosis Date  . Hypertension   . Asthma   . Esophageal dysfunction   . CAD (coronary artery  disease)   . Hypothyroid   . Parkinson disease     Parkinson like   . Dysphagia   . S/P appy   . Hx of cholecystectomy   . Hx of oophorectomy   . Hernia   . Diverticulitis   . S/P coronary artery stent placement     LAD  . Hearing loss     wears hearing aids  . Anxiety     xanax makes her a Zombie"  . Shortness of breath   . Pneumonia     2012  . H/O bladder infections     takes Keflex to prevent  . GERD (gastroesophageal reflux disease)   . Esophageal abnormality     had Botox x2 to relax spinckter  .  H/O hiatal hernia   . Arthritis   . Cancer     top of head- basal  . History of blood transfusion   . Macular degeneration   . Rash/skin eruption     followed by Dermatologist- only thing that helps is [employer] and long showers    PAST SURGICAL HISTORY: Past Surgical History  Procedure Laterality Date  . Appendectomy  1934  . Tonsillectomy and adenoidectomy  1925  . Hernia repair  1978  . Knee surgery  1992    rt knee  . Total abdominal hysterectomy w/ bilateral salpingoophorectomy  2006  . Cataract surgery  2006  . Laminectomy  2009    L4,L5  . Exc basal cell ca on head    . Lumbar laminectomy    . Esophagogastroduodenoscopy N/A 07/02/2012    Procedure: ESOPHAGOGASTRODUODENOSCOPY (EGD);  Surgeon: Beryle Beams, MD;  Location: [facility];  Service: Endoscopy;  Laterality: N/A;  . Botox injection N/A 07/02/2012    Procedure: BOTOX INJECTION;  Surgeon: Beryle Beams, MD;  Location: [facility];  Service: Endoscopy;  Laterality: N/A;  . Retocele    . Abdominal hysterectomy    . Bladder repair  1978, 1989  . Knee arthroscopy Right 1992  . Coronary angioplasty with stent placement  2001  . Hemorrhoid surgery    . Eye surgery Bilateral 2011    laser  . Inguinal hernia repair Right 10/04/2012    Procedure: HERNIA REPAIR INGUINAL ADULT;  Surgeon: Merrie Roof, MD;  Location: [facility];  Service: General;  Laterality: Right;  . Insertion of mesh Right 10/04/2012     Procedure: INSERTION OF MESH;  Surgeon: Merrie Roof, MD;  Location: [facility];  Service: General;  Laterality: Right;    FAMILY HISTORY: Family History  Problem Relation Age of Onset  . Asthma Father   . Asthma Paternal Grandmother     SOCIAL HISTORY: History   Social History  . Marital Status: Widowed    Spouse Name: N/A    Number of Children: 3  . Years of Education: 14   Occupational History  . retired-director of medical record dept   .     Social History Main Topics  . Smoking status: Never Smoker   . Smokeless tobacco: Never Used  . Alcohol Use: No     Comment: occ  . Drug Use: No  . Sexual Activity: Not on file   Other Topics Concern  . Not on file   Social History Narrative   Patient lives at [facility]- retirement center.   Patient is retired.   Patient has 3 children.   Patient is right-handed.   Patient drinks 3 caffeine drinks daily.   Patient has a college education.     PHYSICAL EXAM  Filed Vitals:   07/28/13 1433  BP: 202/88 refused to have BP repeated  Pulse: 67  Height: 4' 11.49" (1.511 m)  Weight: 114 lb (51.71 kg)   Body mass index is 22.65 kg/(m^2).  Generalized: Well developed, in no acute distress  Musculoskeletal: No deformity   Neurological examination   Mentation: Alert oriented to time, place, history taking. Follows all commands speech and language fluent  Cranial nerve II-XII: Pupils were equal round reactive to light extraocular movements were full, visual field were full on confrontational test. Facial sensation and strength were normal. hearing was intact to finger rubbing bilaterally. Uvula tongue midline. head turning and shoulder shrug were normal and symmetric.Tongue protrusion into  cheek strength was normal. Motor: normal bulk and tone, full strength in the BUE, BLE,  No focal weakness Sensory: Length dependent decreased light touch pinprick to mid calf level and decreased vibratory to toes. Preserved  proprioception. Coordination: finger-nose-finger, heel-to-shin bilaterally, no dysmetria Reflexes: 1+ upper lower and symmetric except absent Achilles, plantar responses were flexor bilaterally. Gait and Station: Rising up from seated position without assistance, wide based unsteady gait with rolling walker   DIAGNOSTIC DATA (LABS, IMAGING, TESTING) - I reviewed patient records, labs, notes, testing and imaging myself where available.  Lab Results  Component Value Date   WBC 7.0 10/05/2012   HGB 13.6 10/05/2012   HCT 40.0 10/05/2012   MCV 92.8 10/05/2012   PLT 157 10/05/2012      Component Value Date/Time   NA 134* 10/05/2012 0727   K 3.1* 10/05/2012 0727   CL 95* 10/05/2012 0727   CO2 31 10/05/2012 0727   GLUCOSE 113* 10/05/2012 0727   BUN 9 10/05/2012 0727   CREATININE 0.62 10/05/2012 0727   CALCIUM 9.2 10/05/2012 0727   PROT 6.9 01/24/2011 1527   ALBUMIN 3.9 01/24/2011 1527   AST 79* 01/24/2011 1527   ALT 45* 01/24/2011 1527   ALKPHOS 83 01/24/2011 1527   BILITOT 0.2* 01/24/2011 1527   GFRNONAA 75* 10/05/2012 0727   GFRAA 87* 10/05/2012 0727    ASSESSMENT AND PLAN  80 y.o. year old female  has a past medical history of length dependent sensory changes, mild axonal peripheral neuropathy likely a component of LS stenosis, restless leg syndrome.  Continue carbidopa levodopa at current dose, does not need refills Patient refused to have her blood pressure repeated it was elevated Followup in 6 months Dennie Bible, [facility], [facility], [facility] [address], [place], [place] [address] [phone]

## 2013-07-28 NOTE — Patient Instructions (Signed)
Continue carbidopa levodopa at current dose Followup in 6 months

## 2013-10-13 ENCOUNTER — Ambulatory Visit: Payer: Medicare Other | Admitting: Cardiology

## 2013-11-30 ENCOUNTER — Ambulatory Visit: Payer: Medicare Other | Admitting: Nurse Practitioner

## 2013-12-27 ENCOUNTER — Encounter: Payer: Self-pay | Admitting: *Deleted

## 2014-02-09 ENCOUNTER — Ambulatory Visit: Payer: Medicare Other | Admitting: Nurse Practitioner
